# Patient Record
Sex: Male | Born: 1950 | Race: White | Hispanic: No | Marital: Married | Smoking: Never smoker
Health system: Southern US, Community
[De-identification: ages and names within clinical notes are randomized; demographics above are authoritative.]

## PROBLEM LIST (undated history)

## (undated) DIAGNOSIS — G4730 Sleep apnea, unspecified: Secondary | ICD-10-CM

## (undated) DIAGNOSIS — E119 Type 2 diabetes mellitus without complications: Secondary | ICD-10-CM

## (undated) DIAGNOSIS — I4891 Unspecified atrial fibrillation: Secondary | ICD-10-CM

## (undated) HISTORY — DX: Unspecified atrial fibrillation: I48.91

## (undated) HISTORY — PX: OTHER SURGICAL HISTORY: SHX169

## (undated) HISTORY — DX: Sleep apnea, unspecified: G47.30

---

## 2004-03-25 ENCOUNTER — Ambulatory Visit (HOSPITAL_COMMUNITY): Admission: RE | Admit: 2004-03-25 | Discharge: 2004-03-25 | Payer: Self-pay | Admitting: *Deleted

## 2009-03-12 ENCOUNTER — Encounter: Admission: RE | Admit: 2009-03-12 | Discharge: 2009-03-12 | Payer: Self-pay | Admitting: Family Medicine

## 2009-03-24 ENCOUNTER — Encounter: Admission: RE | Admit: 2009-03-24 | Discharge: 2009-03-24 | Payer: Self-pay | Admitting: Family Medicine

## 2011-02-03 NOTE — Op Note (Signed)
NAME:  Gabriel Hayes, Gabriel Hayes                          ACCOUNT NO.:  [id number]   MEDICAL RECORD NO.:  [id number]                   PATIENT TYPE:  AMB   LOCATION:  ENDO                                 FACILITY:  [facility]   PHYSICIAN:  Georgiana Spinner, M.D.                 DATE OF BIRTH:  1951/05/19   DATE OF PROCEDURE:  DATE OF DISCHARGE:                                 OPERATIVE REPORT   PROCEDURE:  Colonoscopy.   INDICATIONS:  Colon polyp.  Colon cancer screening.   ANESTHESIA:  Demerol 100 mg, Versed 10 mg.   DESCRIPTION OF PROCEDURE:  With the patient mildly sedated and in the left  lateral decubitus position, the Olympus videoscopic colonoscope was inserted  in the rectum and passed under direct vision to the cecum.  This was done  with pressure applied and a change of scope from the PCF-160 to CF-160, both  adjustable, with pressure applied to the abdomen and the patient rolled into  various positions, subsequently rolled to his right side.  We were able to  reach the cecum identified by ileocecal valve and base of the cecum, both of  which were photographed.  From this point, the colonoscope was slowly  withdrawn, taking circumferential views of the colonic mucosa, stopping in  the rectum which appeared normal on direct and showed hemorrhoids on  retroflexed view.  The endoscope was straightened and withdrawn.  The  patient's vital signs and pulse oximetry remained stable.  The patient  tolerated the procedure well without apparent complications.   FINDINGS:  Very difficult, tortuous colon.  Also somewhat difficult due to  the patient's body habitus but unremarkable examination other and internal  hemorrhoids.   PLAN:  Consider repeat examination in 5-10 years.                                               Georgiana Spinner, M.D.    GMO/MEDQ  D:  03/25/2004  T:  03/25/2004  Job:  [id number]   cc:   Meredith Staggers, M.D.  [address]  [place]  [place] [address]  Fax:  [phone]

## 2011-07-18 ENCOUNTER — Other Ambulatory Visit (HOSPITAL_COMMUNITY): Payer: Self-pay | Admitting: Family Medicine

## 2011-07-18 ENCOUNTER — Ambulatory Visit (HOSPITAL_COMMUNITY)
Admission: RE | Admit: 2011-07-18 | Discharge: 2011-07-18 | Disposition: A | Discharge status: Home / Self Care | Payer: BC Managed Care – PPO | Source: Ambulatory Visit | Attending: Family Medicine | Admitting: Family Medicine

## 2011-07-18 DIAGNOSIS — N509 Disorder of male genital organs, unspecified: Secondary | ICD-10-CM | POA: Insufficient documentation

## 2011-07-18 DIAGNOSIS — R52 Pain, unspecified: Secondary | ICD-10-CM

## 2011-07-18 DIAGNOSIS — N453 Epididymo-orchitis: Secondary | ICD-10-CM | POA: Insufficient documentation

## 2014-10-01 DIAGNOSIS — G4731 Primary central sleep apnea: Secondary | ICD-10-CM | POA: Diagnosis not present

## 2014-10-01 DIAGNOSIS — G4733 Obstructive sleep apnea (adult) (pediatric): Secondary | ICD-10-CM | POA: Diagnosis not present

## 2015-02-12 DIAGNOSIS — G4733 Obstructive sleep apnea (adult) (pediatric): Secondary | ICD-10-CM | POA: Diagnosis not present

## 2015-03-26 DIAGNOSIS — Z9989 Dependence on other enabling machines and devices: Secondary | ICD-10-CM | POA: Diagnosis not present

## 2015-03-26 DIAGNOSIS — G4733 Obstructive sleep apnea (adult) (pediatric): Secondary | ICD-10-CM | POA: Diagnosis not present

## 2015-05-06 DIAGNOSIS — Z0000 Encounter for general adult medical examination without abnormal findings: Secondary | ICD-10-CM | POA: Diagnosis not present

## 2015-05-06 DIAGNOSIS — R7309 Other abnormal glucose: Secondary | ICD-10-CM | POA: Diagnosis not present

## 2015-05-06 DIAGNOSIS — G4730 Sleep apnea, unspecified: Secondary | ICD-10-CM | POA: Diagnosis not present

## 2015-05-06 DIAGNOSIS — M47812 Spondylosis without myelopathy or radiculopathy, cervical region: Secondary | ICD-10-CM | POA: Diagnosis not present

## 2015-05-06 DIAGNOSIS — D229 Melanocytic nevi, unspecified: Secondary | ICD-10-CM | POA: Diagnosis not present

## 2015-05-06 DIAGNOSIS — E785 Hyperlipidemia, unspecified: Secondary | ICD-10-CM | POA: Diagnosis not present

## 2015-05-06 DIAGNOSIS — R0982 Postnasal drip: Secondary | ICD-10-CM | POA: Diagnosis not present

## 2015-05-06 DIAGNOSIS — Z1211 Encounter for screening for malignant neoplasm of colon: Secondary | ICD-10-CM | POA: Diagnosis not present

## 2015-06-15 DIAGNOSIS — G4733 Obstructive sleep apnea (adult) (pediatric): Secondary | ICD-10-CM | POA: Diagnosis not present

## 2015-08-09 DIAGNOSIS — J069 Acute upper respiratory infection, unspecified: Secondary | ICD-10-CM | POA: Diagnosis not present

## 2015-08-09 DIAGNOSIS — R05 Cough: Secondary | ICD-10-CM | POA: Diagnosis not present

## 2015-08-19 DIAGNOSIS — J0100 Acute maxillary sinusitis, unspecified: Secondary | ICD-10-CM | POA: Diagnosis not present

## 2015-08-19 DIAGNOSIS — J209 Acute bronchitis, unspecified: Secondary | ICD-10-CM | POA: Diagnosis not present

## 2015-08-31 DIAGNOSIS — R040 Epistaxis: Secondary | ICD-10-CM | POA: Diagnosis not present

## 2015-09-01 DIAGNOSIS — H2513 Age-related nuclear cataract, bilateral: Secondary | ICD-10-CM | POA: Diagnosis not present

## 2015-09-01 DIAGNOSIS — H4911 Fourth [trochlear] nerve palsy, right eye: Secondary | ICD-10-CM | POA: Diagnosis not present

## 2015-09-01 DIAGNOSIS — H40023 Open angle with borderline findings, high risk, bilateral: Secondary | ICD-10-CM | POA: Diagnosis not present

## 2015-09-07 DIAGNOSIS — J069 Acute upper respiratory infection, unspecified: Secondary | ICD-10-CM | POA: Diagnosis not present

## 2015-09-07 DIAGNOSIS — J0100 Acute maxillary sinusitis, unspecified: Secondary | ICD-10-CM | POA: Diagnosis not present

## 2015-10-20 DIAGNOSIS — F5104 Psychophysiologic insomnia: Secondary | ICD-10-CM | POA: Diagnosis not present

## 2015-10-20 DIAGNOSIS — G4733 Obstructive sleep apnea (adult) (pediatric): Secondary | ICD-10-CM | POA: Diagnosis not present

## 2015-10-20 DIAGNOSIS — Z9989 Dependence on other enabling machines and devices: Secondary | ICD-10-CM | POA: Diagnosis not present

## 2015-11-11 DIAGNOSIS — J0100 Acute maxillary sinusitis, unspecified: Secondary | ICD-10-CM | POA: Diagnosis not present

## 2015-12-28 DIAGNOSIS — G4733 Obstructive sleep apnea (adult) (pediatric): Secondary | ICD-10-CM | POA: Diagnosis not present

## 2016-03-02 DIAGNOSIS — H5210 Myopia, unspecified eye: Secondary | ICD-10-CM | POA: Diagnosis not present

## 2016-03-02 DIAGNOSIS — H52203 Unspecified astigmatism, bilateral: Secondary | ICD-10-CM | POA: Diagnosis not present

## 2016-03-02 DIAGNOSIS — Z0100 Encounter for examination of eyes and vision without abnormal findings: Secondary | ICD-10-CM | POA: Diagnosis not present

## 2016-05-08 DIAGNOSIS — E785 Hyperlipidemia, unspecified: Secondary | ICD-10-CM | POA: Diagnosis not present

## 2016-05-08 DIAGNOSIS — R040 Epistaxis: Secondary | ICD-10-CM | POA: Diagnosis not present

## 2016-05-08 DIAGNOSIS — Z0000 Encounter for general adult medical examination without abnormal findings: Secondary | ICD-10-CM | POA: Diagnosis not present

## 2016-05-08 DIAGNOSIS — R7301 Impaired fasting glucose: Secondary | ICD-10-CM | POA: Diagnosis not present

## 2016-05-08 DIAGNOSIS — Z125 Encounter for screening for malignant neoplasm of prostate: Secondary | ICD-10-CM | POA: Diagnosis not present

## 2016-05-08 DIAGNOSIS — J0191 Acute recurrent sinusitis, unspecified: Secondary | ICD-10-CM | POA: Diagnosis not present

## 2016-05-08 DIAGNOSIS — G4730 Sleep apnea, unspecified: Secondary | ICD-10-CM | POA: Diagnosis not present

## 2016-05-08 DIAGNOSIS — Z1211 Encounter for screening for malignant neoplasm of colon: Secondary | ICD-10-CM | POA: Diagnosis not present

## 2016-05-08 DIAGNOSIS — M47812 Spondylosis without myelopathy or radiculopathy, cervical region: Secondary | ICD-10-CM | POA: Diagnosis not present

## 2016-05-08 DIAGNOSIS — D229 Melanocytic nevi, unspecified: Secondary | ICD-10-CM | POA: Diagnosis not present

## 2016-07-31 DIAGNOSIS — G4733 Obstructive sleep apnea (adult) (pediatric): Secondary | ICD-10-CM | POA: Diagnosis not present

## 2016-08-30 DIAGNOSIS — Z23 Encounter for immunization: Secondary | ICD-10-CM | POA: Diagnosis not present

## 2016-09-29 DIAGNOSIS — Z8679 Personal history of other diseases of the circulatory system: Secondary | ICD-10-CM | POA: Diagnosis not present

## 2016-09-29 DIAGNOSIS — G4730 Sleep apnea, unspecified: Secondary | ICD-10-CM | POA: Diagnosis not present

## 2016-10-06 DIAGNOSIS — F5104 Psychophysiologic insomnia: Secondary | ICD-10-CM | POA: Diagnosis not present

## 2016-10-06 DIAGNOSIS — Z9989 Dependence on other enabling machines and devices: Secondary | ICD-10-CM | POA: Diagnosis not present

## 2016-10-06 DIAGNOSIS — G4733 Obstructive sleep apnea (adult) (pediatric): Secondary | ICD-10-CM | POA: Diagnosis not present

## 2016-11-06 DIAGNOSIS — G4733 Obstructive sleep apnea (adult) (pediatric): Secondary | ICD-10-CM | POA: Diagnosis not present

## 2017-01-11 ENCOUNTER — Other Ambulatory Visit: Payer: Self-pay | Admitting: Family Medicine

## 2017-01-11 ENCOUNTER — Ambulatory Visit
Admission: RE | Admit: 2017-01-11 | Discharge: 2017-01-11 | Disposition: A | Discharge status: Home / Self Care | Payer: Medicare HMO | Source: Ambulatory Visit | Attending: Family Medicine | Admitting: Family Medicine

## 2017-01-11 DIAGNOSIS — M545 Low back pain, unspecified: Secondary | ICD-10-CM

## 2017-01-11 DIAGNOSIS — M5136 Other intervertebral disc degeneration, lumbar region: Secondary | ICD-10-CM | POA: Diagnosis not present

## 2017-02-06 DIAGNOSIS — G4733 Obstructive sleep apnea (adult) (pediatric): Secondary | ICD-10-CM | POA: Diagnosis not present

## 2017-02-26 DIAGNOSIS — M779 Enthesopathy, unspecified: Secondary | ICD-10-CM | POA: Diagnosis not present

## 2017-02-26 DIAGNOSIS — M25561 Pain in right knee: Secondary | ICD-10-CM | POA: Diagnosis not present

## 2017-02-26 DIAGNOSIS — M7651 Patellar tendinitis, right knee: Secondary | ICD-10-CM | POA: Diagnosis not present

## 2017-02-26 DIAGNOSIS — I4891 Unspecified atrial fibrillation: Secondary | ICD-10-CM | POA: Diagnosis not present

## 2017-02-26 DIAGNOSIS — Z8249 Family history of ischemic heart disease and other diseases of the circulatory system: Secondary | ICD-10-CM | POA: Diagnosis not present

## 2017-02-26 DIAGNOSIS — G4730 Sleep apnea, unspecified: Secondary | ICD-10-CM | POA: Diagnosis not present

## 2017-03-05 DIAGNOSIS — M7651 Patellar tendinitis, right knee: Secondary | ICD-10-CM | POA: Diagnosis not present

## 2017-03-05 DIAGNOSIS — M659 Synovitis and tenosynovitis, unspecified: Secondary | ICD-10-CM | POA: Diagnosis not present

## 2017-03-13 DIAGNOSIS — H02834 Dermatochalasis of left upper eyelid: Secondary | ICD-10-CM | POA: Diagnosis not present

## 2017-03-13 DIAGNOSIS — H524 Presbyopia: Secondary | ICD-10-CM | POA: Diagnosis not present

## 2017-03-13 DIAGNOSIS — H43393 Other vitreous opacities, bilateral: Secondary | ICD-10-CM | POA: Diagnosis not present

## 2017-03-13 DIAGNOSIS — H04123 Dry eye syndrome of bilateral lacrimal glands: Secondary | ICD-10-CM | POA: Diagnosis not present

## 2017-03-13 DIAGNOSIS — H52203 Unspecified astigmatism, bilateral: Secondary | ICD-10-CM | POA: Diagnosis not present

## 2017-03-13 DIAGNOSIS — H02831 Dermatochalasis of right upper eyelid: Secondary | ICD-10-CM | POA: Diagnosis not present

## 2017-03-13 DIAGNOSIS — H2513 Age-related nuclear cataract, bilateral: Secondary | ICD-10-CM | POA: Diagnosis not present

## 2017-03-26 DIAGNOSIS — M7651 Patellar tendinitis, right knee: Secondary | ICD-10-CM | POA: Diagnosis not present

## 2017-03-26 DIAGNOSIS — M659 Synovitis and tenosynovitis, unspecified: Secondary | ICD-10-CM | POA: Diagnosis not present

## 2017-04-19 DIAGNOSIS — G4733 Obstructive sleep apnea (adult) (pediatric): Secondary | ICD-10-CM | POA: Diagnosis not present

## 2017-04-23 DIAGNOSIS — R197 Diarrhea, unspecified: Secondary | ICD-10-CM | POA: Diagnosis not present

## 2017-04-23 DIAGNOSIS — R1084 Generalized abdominal pain: Secondary | ICD-10-CM | POA: Diagnosis not present

## 2017-04-23 DIAGNOSIS — I700 Atherosclerosis of aorta: Secondary | ICD-10-CM | POA: Diagnosis not present

## 2017-04-23 DIAGNOSIS — I2510 Atherosclerotic heart disease of native coronary artery without angina pectoris: Secondary | ICD-10-CM | POA: Diagnosis not present

## 2017-04-23 DIAGNOSIS — R109 Unspecified abdominal pain: Secondary | ICD-10-CM | POA: Diagnosis not present

## 2017-04-23 DIAGNOSIS — N281 Cyst of kidney, acquired: Secondary | ICD-10-CM | POA: Diagnosis not present

## 2017-04-23 DIAGNOSIS — G4730 Sleep apnea, unspecified: Secondary | ICD-10-CM | POA: Diagnosis not present

## 2017-04-23 DIAGNOSIS — I4891 Unspecified atrial fibrillation: Secondary | ICD-10-CM | POA: Diagnosis not present

## 2017-04-23 DIAGNOSIS — R110 Nausea: Secondary | ICD-10-CM | POA: Diagnosis not present

## 2017-04-23 DIAGNOSIS — R140 Abdominal distension (gaseous): Secondary | ICD-10-CM | POA: Diagnosis not present

## 2017-04-23 DIAGNOSIS — K529 Noninfective gastroenteritis and colitis, unspecified: Secondary | ICD-10-CM | POA: Diagnosis not present

## 2017-04-24 DIAGNOSIS — R197 Diarrhea, unspecified: Secondary | ICD-10-CM | POA: Diagnosis not present

## 2017-04-24 DIAGNOSIS — R109 Unspecified abdominal pain: Secondary | ICD-10-CM | POA: Diagnosis not present

## 2017-04-26 DIAGNOSIS — G4733 Obstructive sleep apnea (adult) (pediatric): Secondary | ICD-10-CM | POA: Diagnosis not present

## 2017-04-27 DIAGNOSIS — Z1389 Encounter for screening for other disorder: Secondary | ICD-10-CM | POA: Diagnosis not present

## 2017-04-27 DIAGNOSIS — Z8719 Personal history of other diseases of the digestive system: Secondary | ICD-10-CM | POA: Diagnosis not present

## 2017-04-27 DIAGNOSIS — M25561 Pain in right knee: Secondary | ICD-10-CM | POA: Diagnosis not present

## 2017-04-27 DIAGNOSIS — K529 Noninfective gastroenteritis and colitis, unspecified: Secondary | ICD-10-CM | POA: Diagnosis not present

## 2017-05-01 DIAGNOSIS — R1084 Generalized abdominal pain: Secondary | ICD-10-CM | POA: Diagnosis not present

## 2017-05-10 DIAGNOSIS — Z125 Encounter for screening for malignant neoplasm of prostate: Secondary | ICD-10-CM | POA: Diagnosis not present

## 2017-05-10 DIAGNOSIS — I700 Atherosclerosis of aorta: Secondary | ICD-10-CM | POA: Diagnosis not present

## 2017-05-10 DIAGNOSIS — M6208 Separation of muscle (nontraumatic), other site: Secondary | ICD-10-CM | POA: Diagnosis not present

## 2017-05-10 DIAGNOSIS — Z1211 Encounter for screening for malignant neoplasm of colon: Secondary | ICD-10-CM | POA: Diagnosis not present

## 2017-05-10 DIAGNOSIS — R7303 Prediabetes: Secondary | ICD-10-CM | POA: Diagnosis not present

## 2017-05-10 DIAGNOSIS — Z0000 Encounter for general adult medical examination without abnormal findings: Secondary | ICD-10-CM | POA: Diagnosis not present

## 2017-05-10 DIAGNOSIS — K529 Noninfective gastroenteritis and colitis, unspecified: Secondary | ICD-10-CM | POA: Diagnosis not present

## 2017-05-10 DIAGNOSIS — G4730 Sleep apnea, unspecified: Secondary | ICD-10-CM | POA: Diagnosis not present

## 2017-05-10 DIAGNOSIS — E7800 Pure hypercholesterolemia, unspecified: Secondary | ICD-10-CM | POA: Diagnosis not present

## 2017-05-10 DIAGNOSIS — M47812 Spondylosis without myelopathy or radiculopathy, cervical region: Secondary | ICD-10-CM | POA: Diagnosis not present

## 2017-05-10 DIAGNOSIS — Z23 Encounter for immunization: Secondary | ICD-10-CM | POA: Diagnosis not present

## 2017-05-23 DIAGNOSIS — E7800 Pure hypercholesterolemia, unspecified: Secondary | ICD-10-CM | POA: Diagnosis not present

## 2017-06-22 DIAGNOSIS — G4733 Obstructive sleep apnea (adult) (pediatric): Secondary | ICD-10-CM | POA: Diagnosis not present

## 2017-10-16 DIAGNOSIS — G4733 Obstructive sleep apnea (adult) (pediatric): Secondary | ICD-10-CM | POA: Diagnosis not present

## 2017-12-07 DIAGNOSIS — G4733 Obstructive sleep apnea (adult) (pediatric): Secondary | ICD-10-CM | POA: Diagnosis not present

## 2017-12-07 DIAGNOSIS — F5104 Psychophysiologic insomnia: Secondary | ICD-10-CM | POA: Diagnosis not present

## 2018-01-20 DIAGNOSIS — G4733 Obstructive sleep apnea (adult) (pediatric): Secondary | ICD-10-CM | POA: Diagnosis not present

## 2018-01-22 IMAGING — DX DG LUMBAR SPINE 2-3V
3 series · 3 of 3 positions shown · non-contrast
Comparison: None

CLINICAL DATA: Right-sided low back pain for 1 month

EXAM:
LUMBAR SPINE - 2-3 VIEW

[dg lumbar spine 2-3 views (1 of 3)]
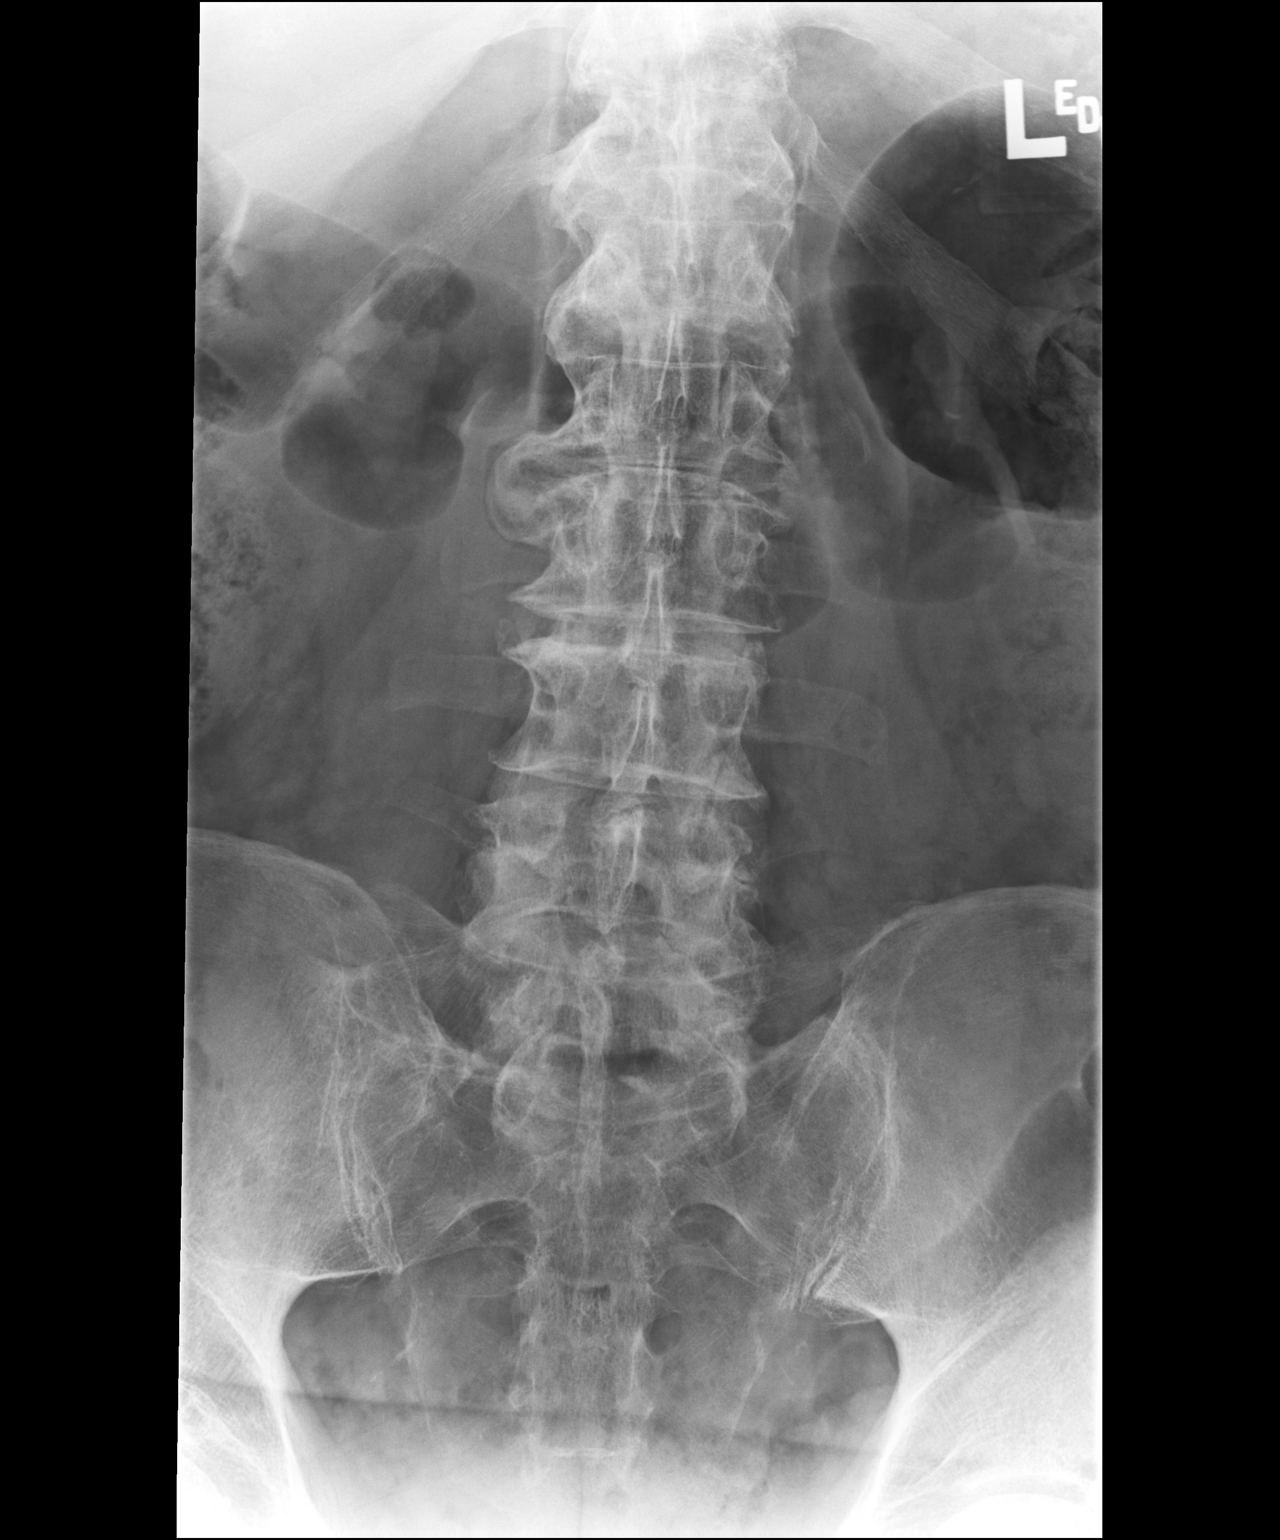

[dg lumbar spine 2-3 views (2 of 3)]
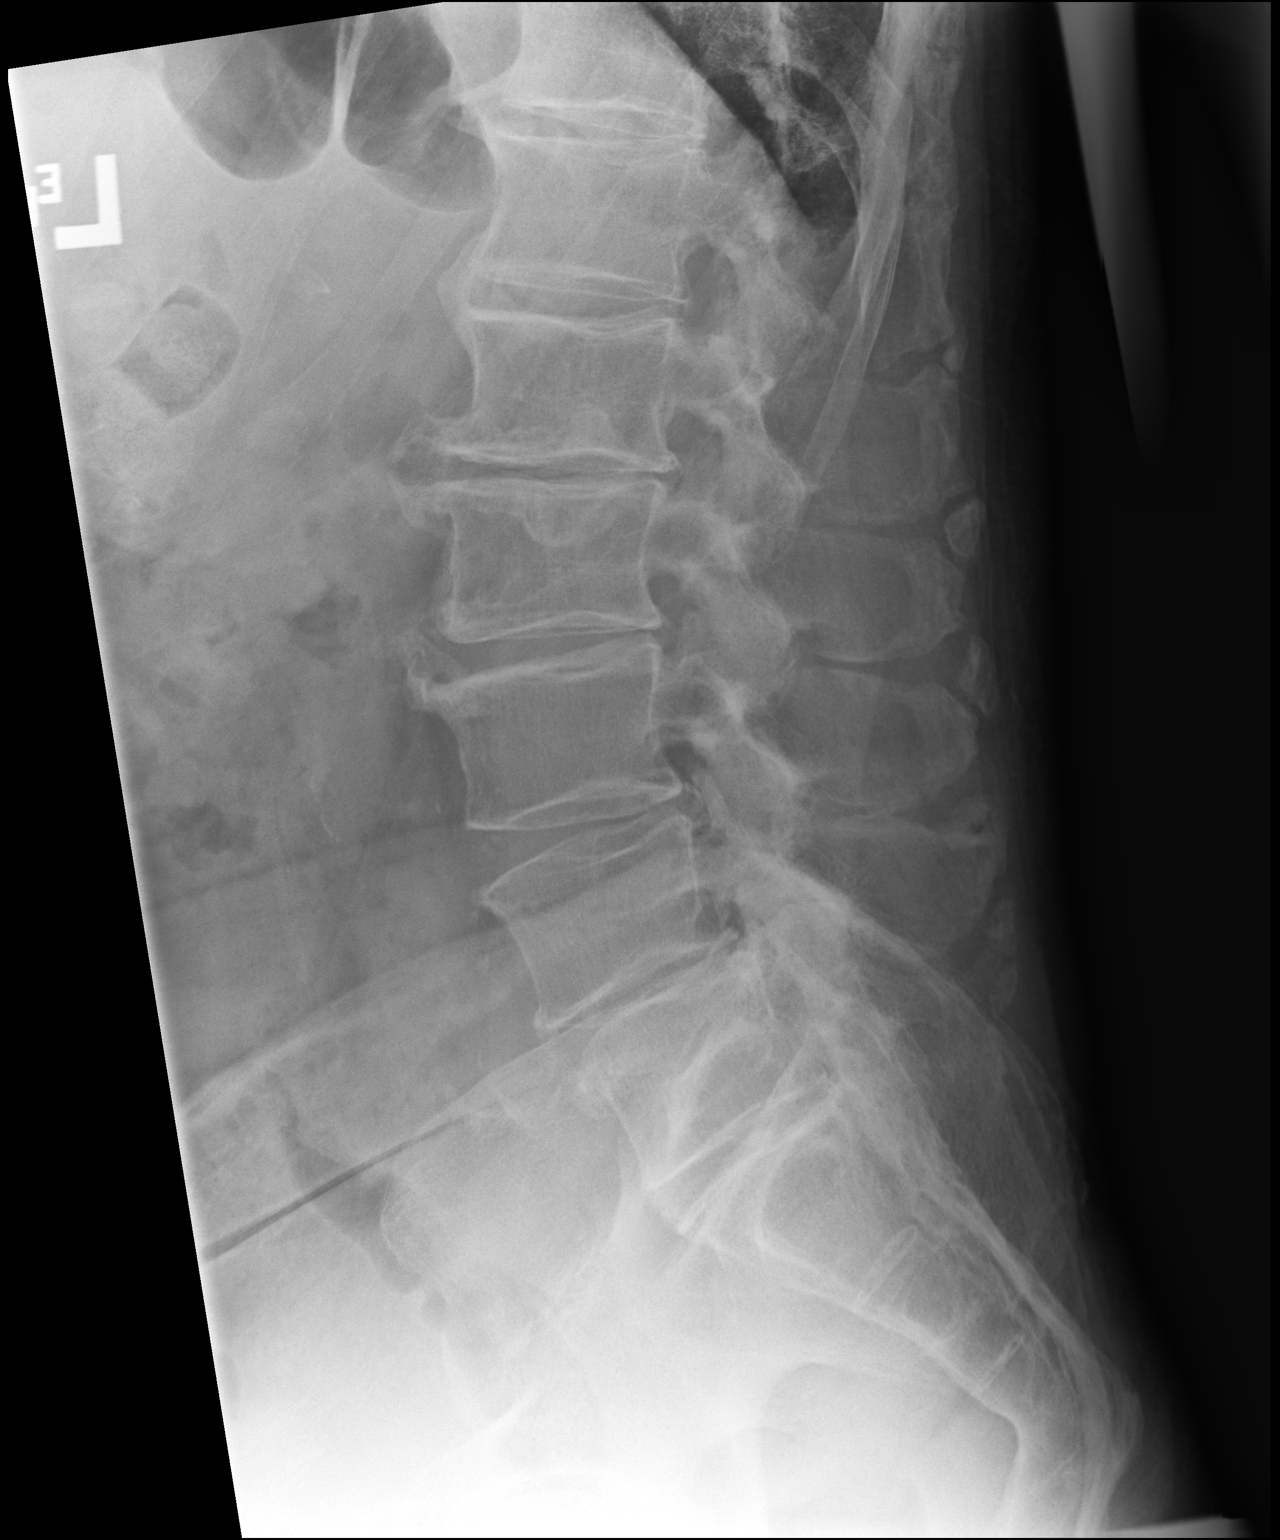

[dg lumbar spine 2-3 views (3 of 3)]
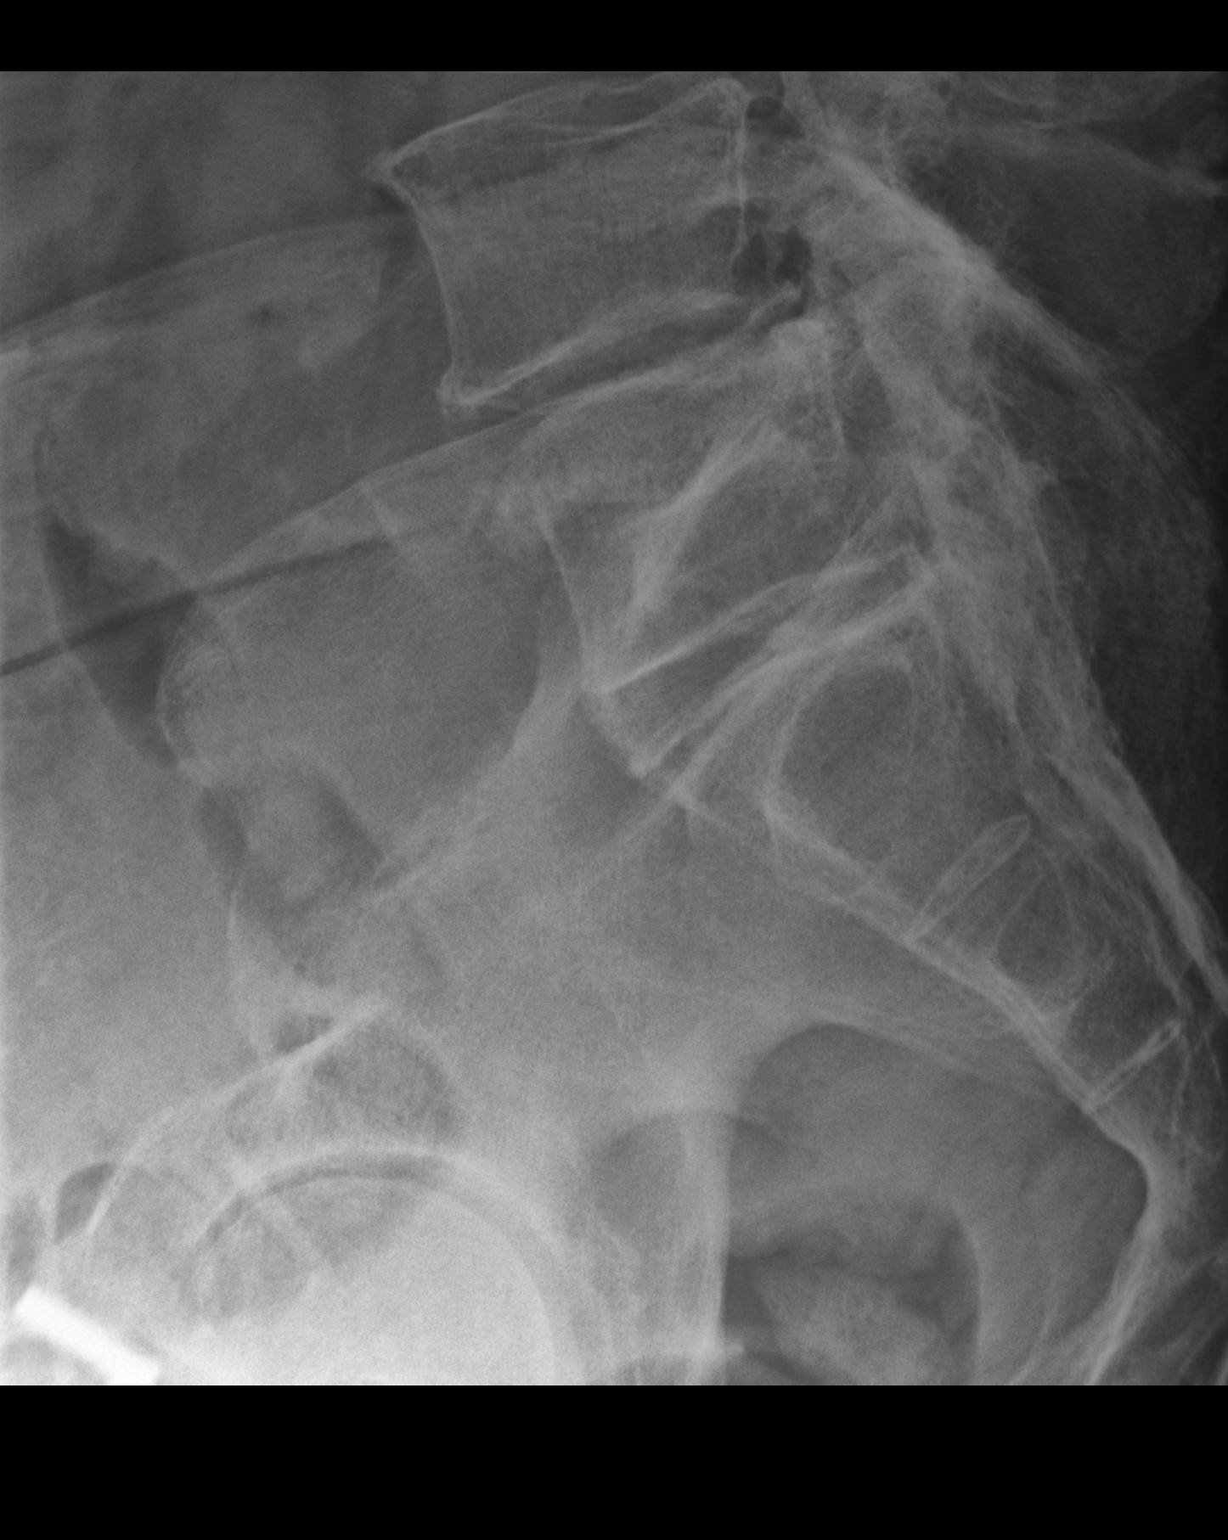

[3 of 3 positions shown; findings below may reference images not displayed]

FINDINGS: There is slight anterolisthesis of L4 on L5 by approximately 7 mm
which appears to be due to degenerative change of the facet joints.
There is degenerative disc disease at L1-2, L2-3, and L4-5 levels
with loss of disc space and spurring. No compression deformity is
seen. The SI joints appear corticated.
IMPRESSION: 1. 7 mm anterolisthesis of L4 on L5 which appears degenerative in
origin.
2. Degenerative disc disease as noted above. No acute compression
deformity.

## 2018-01-25 DIAGNOSIS — G4733 Obstructive sleep apnea (adult) (pediatric): Secondary | ICD-10-CM | POA: Diagnosis not present

## 2018-03-04 DIAGNOSIS — G4733 Obstructive sleep apnea (adult) (pediatric): Secondary | ICD-10-CM | POA: Diagnosis not present

## 2018-03-14 DIAGNOSIS — H2513 Age-related nuclear cataract, bilateral: Secondary | ICD-10-CM | POA: Diagnosis not present

## 2018-03-14 DIAGNOSIS — H43393 Other vitreous opacities, bilateral: Secondary | ICD-10-CM | POA: Diagnosis not present

## 2018-03-14 DIAGNOSIS — H02831 Dermatochalasis of right upper eyelid: Secondary | ICD-10-CM | POA: Diagnosis not present

## 2018-03-14 DIAGNOSIS — H16223 Keratoconjunctivitis sicca, not specified as Sjogren's, bilateral: Secondary | ICD-10-CM | POA: Diagnosis not present

## 2018-03-14 DIAGNOSIS — H02834 Dermatochalasis of left upper eyelid: Secondary | ICD-10-CM | POA: Diagnosis not present

## 2018-03-14 DIAGNOSIS — H524 Presbyopia: Secondary | ICD-10-CM | POA: Diagnosis not present

## 2018-03-14 DIAGNOSIS — H52203 Unspecified astigmatism, bilateral: Secondary | ICD-10-CM | POA: Diagnosis not present

## 2018-04-29 DIAGNOSIS — R9431 Abnormal electrocardiogram [ECG] [EKG]: Secondary | ICD-10-CM | POA: Diagnosis not present

## 2018-04-29 DIAGNOSIS — I4891 Unspecified atrial fibrillation: Secondary | ICD-10-CM | POA: Diagnosis not present

## 2018-05-08 DIAGNOSIS — G4737 Central sleep apnea in conditions classified elsewhere: Secondary | ICD-10-CM | POA: Diagnosis not present

## 2018-05-08 DIAGNOSIS — G4733 Obstructive sleep apnea (adult) (pediatric): Secondary | ICD-10-CM | POA: Diagnosis not present

## 2018-05-14 DIAGNOSIS — Z1211 Encounter for screening for malignant neoplasm of colon: Secondary | ICD-10-CM | POA: Diagnosis not present

## 2018-05-14 DIAGNOSIS — G4730 Sleep apnea, unspecified: Secondary | ICD-10-CM | POA: Diagnosis not present

## 2018-05-14 DIAGNOSIS — Z1389 Encounter for screening for other disorder: Secondary | ICD-10-CM | POA: Diagnosis not present

## 2018-05-14 DIAGNOSIS — Z23 Encounter for immunization: Secondary | ICD-10-CM | POA: Diagnosis not present

## 2018-05-14 DIAGNOSIS — E7800 Pure hypercholesterolemia, unspecified: Secondary | ICD-10-CM | POA: Diagnosis not present

## 2018-05-14 DIAGNOSIS — I700 Atherosclerosis of aorta: Secondary | ICD-10-CM | POA: Diagnosis not present

## 2018-05-14 DIAGNOSIS — Z0000 Encounter for general adult medical examination without abnormal findings: Secondary | ICD-10-CM | POA: Diagnosis not present

## 2018-05-14 DIAGNOSIS — R7303 Prediabetes: Secondary | ICD-10-CM | POA: Diagnosis not present

## 2018-05-14 DIAGNOSIS — M47812 Spondylosis without myelopathy or radiculopathy, cervical region: Secondary | ICD-10-CM | POA: Diagnosis not present

## 2018-05-14 DIAGNOSIS — Z125 Encounter for screening for malignant neoplasm of prostate: Secondary | ICD-10-CM | POA: Diagnosis not present

## 2018-06-08 DIAGNOSIS — G4733 Obstructive sleep apnea (adult) (pediatric): Secondary | ICD-10-CM | POA: Diagnosis not present

## 2018-06-08 DIAGNOSIS — G4731 Primary central sleep apnea: Secondary | ICD-10-CM | POA: Diagnosis not present

## 2018-06-12 DIAGNOSIS — G4733 Obstructive sleep apnea (adult) (pediatric): Secondary | ICD-10-CM | POA: Diagnosis not present

## 2018-06-21 DIAGNOSIS — G4733 Obstructive sleep apnea (adult) (pediatric): Secondary | ICD-10-CM | POA: Diagnosis not present

## 2018-06-21 DIAGNOSIS — Z9989 Dependence on other enabling machines and devices: Secondary | ICD-10-CM | POA: Diagnosis not present

## 2018-06-21 DIAGNOSIS — F5104 Psychophysiologic insomnia: Secondary | ICD-10-CM | POA: Diagnosis not present

## 2018-07-08 DIAGNOSIS — G4733 Obstructive sleep apnea (adult) (pediatric): Secondary | ICD-10-CM | POA: Diagnosis not present

## 2018-07-08 DIAGNOSIS — G4731 Primary central sleep apnea: Secondary | ICD-10-CM | POA: Diagnosis not present

## 2018-08-08 DIAGNOSIS — G4731 Primary central sleep apnea: Secondary | ICD-10-CM | POA: Diagnosis not present

## 2018-09-07 DIAGNOSIS — G4731 Primary central sleep apnea: Secondary | ICD-10-CM | POA: Diagnosis not present

## 2018-09-10 DIAGNOSIS — G4733 Obstructive sleep apnea (adult) (pediatric): Secondary | ICD-10-CM | POA: Diagnosis not present

## 2018-10-08 DIAGNOSIS — G4731 Primary central sleep apnea: Secondary | ICD-10-CM | POA: Diagnosis not present

## 2018-11-08 DIAGNOSIS — G4731 Primary central sleep apnea: Secondary | ICD-10-CM | POA: Diagnosis not present

## 2018-11-12 DIAGNOSIS — E1169 Type 2 diabetes mellitus with other specified complication: Secondary | ICD-10-CM | POA: Diagnosis not present

## 2018-11-12 DIAGNOSIS — I700 Atherosclerosis of aorta: Secondary | ICD-10-CM | POA: Diagnosis not present

## 2018-11-12 DIAGNOSIS — G4730 Sleep apnea, unspecified: Secondary | ICD-10-CM | POA: Diagnosis not present

## 2018-11-12 DIAGNOSIS — E7800 Pure hypercholesterolemia, unspecified: Secondary | ICD-10-CM | POA: Diagnosis not present

## 2018-11-19 DIAGNOSIS — J0100 Acute maxillary sinusitis, unspecified: Secondary | ICD-10-CM | POA: Diagnosis not present

## 2018-11-19 DIAGNOSIS — Z6841 Body Mass Index (BMI) 40.0 and over, adult: Secondary | ICD-10-CM | POA: Diagnosis not present

## 2018-11-19 DIAGNOSIS — R05 Cough: Secondary | ICD-10-CM | POA: Diagnosis not present

## 2018-11-26 DIAGNOSIS — G4733 Obstructive sleep apnea (adult) (pediatric): Secondary | ICD-10-CM | POA: Diagnosis not present

## 2018-11-28 DIAGNOSIS — R040 Epistaxis: Secondary | ICD-10-CM | POA: Diagnosis not present

## 2018-12-07 DIAGNOSIS — G4731 Primary central sleep apnea: Secondary | ICD-10-CM | POA: Diagnosis not present

## 2018-12-12 DIAGNOSIS — R040 Epistaxis: Secondary | ICD-10-CM | POA: Diagnosis not present

## 2018-12-31 DIAGNOSIS — G4733 Obstructive sleep apnea (adult) (pediatric): Secondary | ICD-10-CM | POA: Diagnosis not present

## 2019-01-07 DIAGNOSIS — G4731 Primary central sleep apnea: Secondary | ICD-10-CM | POA: Diagnosis not present

## 2019-01-07 DIAGNOSIS — Z87898 Personal history of other specified conditions: Secondary | ICD-10-CM | POA: Diagnosis not present

## 2019-01-07 DIAGNOSIS — S30863A Insect bite (nonvenomous) of scrotum and testes, initial encounter: Secondary | ICD-10-CM | POA: Diagnosis not present

## 2019-01-07 DIAGNOSIS — R05 Cough: Secondary | ICD-10-CM | POA: Diagnosis not present

## 2019-01-07 DIAGNOSIS — W57XXXA Bitten or stung by nonvenomous insect and other nonvenomous arthropods, initial encounter: Secondary | ICD-10-CM | POA: Diagnosis not present

## 2019-02-06 DIAGNOSIS — G4731 Primary central sleep apnea: Secondary | ICD-10-CM | POA: Diagnosis not present

## 2019-03-09 DIAGNOSIS — G4731 Primary central sleep apnea: Secondary | ICD-10-CM | POA: Diagnosis not present

## 2019-03-17 DIAGNOSIS — H02834 Dermatochalasis of left upper eyelid: Secondary | ICD-10-CM | POA: Diagnosis not present

## 2019-03-17 DIAGNOSIS — H43393 Other vitreous opacities, bilateral: Secondary | ICD-10-CM | POA: Diagnosis not present

## 2019-03-17 DIAGNOSIS — H5211 Myopia, right eye: Secondary | ICD-10-CM | POA: Diagnosis not present

## 2019-03-17 DIAGNOSIS — H02831 Dermatochalasis of right upper eyelid: Secondary | ICD-10-CM | POA: Diagnosis not present

## 2019-03-17 DIAGNOSIS — H524 Presbyopia: Secondary | ICD-10-CM | POA: Diagnosis not present

## 2019-03-17 DIAGNOSIS — H16223 Keratoconjunctivitis sicca, not specified as Sjogren's, bilateral: Secondary | ICD-10-CM | POA: Diagnosis not present

## 2019-03-17 DIAGNOSIS — H5202 Hypermetropia, left eye: Secondary | ICD-10-CM | POA: Diagnosis not present

## 2019-03-17 DIAGNOSIS — H2513 Age-related nuclear cataract, bilateral: Secondary | ICD-10-CM | POA: Diagnosis not present

## 2019-03-17 DIAGNOSIS — H52203 Unspecified astigmatism, bilateral: Secondary | ICD-10-CM | POA: Diagnosis not present

## 2019-04-07 DIAGNOSIS — G4731 Primary central sleep apnea: Secondary | ICD-10-CM | POA: Diagnosis not present

## 2019-04-07 DIAGNOSIS — G4733 Obstructive sleep apnea (adult) (pediatric): Secondary | ICD-10-CM | POA: Diagnosis not present

## 2019-04-08 DIAGNOSIS — G4733 Obstructive sleep apnea (adult) (pediatric): Secondary | ICD-10-CM | POA: Diagnosis not present

## 2019-04-08 DIAGNOSIS — G4731 Primary central sleep apnea: Secondary | ICD-10-CM | POA: Diagnosis not present

## 2019-05-09 DIAGNOSIS — G4731 Primary central sleep apnea: Secondary | ICD-10-CM | POA: Diagnosis not present

## 2019-05-16 DIAGNOSIS — Z1389 Encounter for screening for other disorder: Secondary | ICD-10-CM | POA: Diagnosis not present

## 2019-05-16 DIAGNOSIS — Z1211 Encounter for screening for malignant neoplasm of colon: Secondary | ICD-10-CM | POA: Diagnosis not present

## 2019-05-16 DIAGNOSIS — E1169 Type 2 diabetes mellitus with other specified complication: Secondary | ICD-10-CM | POA: Diagnosis not present

## 2019-05-16 DIAGNOSIS — Z125 Encounter for screening for malignant neoplasm of prostate: Secondary | ICD-10-CM | POA: Diagnosis not present

## 2019-05-16 DIAGNOSIS — M47812 Spondylosis without myelopathy or radiculopathy, cervical region: Secondary | ICD-10-CM | POA: Diagnosis not present

## 2019-05-16 DIAGNOSIS — G4730 Sleep apnea, unspecified: Secondary | ICD-10-CM | POA: Diagnosis not present

## 2019-05-16 DIAGNOSIS — I700 Atherosclerosis of aorta: Secondary | ICD-10-CM | POA: Diagnosis not present

## 2019-05-16 DIAGNOSIS — Z0000 Encounter for general adult medical examination without abnormal findings: Secondary | ICD-10-CM | POA: Diagnosis not present

## 2019-05-16 DIAGNOSIS — E7800 Pure hypercholesterolemia, unspecified: Secondary | ICD-10-CM | POA: Diagnosis not present

## 2019-06-05 DIAGNOSIS — Z23 Encounter for immunization: Secondary | ICD-10-CM | POA: Diagnosis not present

## 2019-06-05 DIAGNOSIS — E7800 Pure hypercholesterolemia, unspecified: Secondary | ICD-10-CM | POA: Diagnosis not present

## 2019-06-05 DIAGNOSIS — Z1211 Encounter for screening for malignant neoplasm of colon: Secondary | ICD-10-CM | POA: Diagnosis not present

## 2019-06-05 DIAGNOSIS — Z125 Encounter for screening for malignant neoplasm of prostate: Secondary | ICD-10-CM | POA: Diagnosis not present

## 2019-06-05 DIAGNOSIS — E1169 Type 2 diabetes mellitus with other specified complication: Secondary | ICD-10-CM | POA: Diagnosis not present

## 2019-07-11 DIAGNOSIS — G4733 Obstructive sleep apnea (adult) (pediatric): Secondary | ICD-10-CM | POA: Diagnosis not present

## 2019-07-11 DIAGNOSIS — Z9989 Dependence on other enabling machines and devices: Secondary | ICD-10-CM | POA: Diagnosis not present

## 2019-07-11 DIAGNOSIS — F5104 Psychophysiologic insomnia: Secondary | ICD-10-CM | POA: Diagnosis not present

## 2019-07-14 DIAGNOSIS — K921 Melena: Secondary | ICD-10-CM | POA: Diagnosis not present

## 2019-07-22 DIAGNOSIS — Z1159 Encounter for screening for other viral diseases: Secondary | ICD-10-CM | POA: Diagnosis not present

## 2019-07-25 DIAGNOSIS — U071 COVID-19: Secondary | ICD-10-CM | POA: Diagnosis not present

## 2019-07-25 DIAGNOSIS — R195 Other fecal abnormalities: Secondary | ICD-10-CM | POA: Diagnosis not present

## 2019-07-30 DIAGNOSIS — G4733 Obstructive sleep apnea (adult) (pediatric): Secondary | ICD-10-CM | POA: Diagnosis not present

## 2019-10-14 DIAGNOSIS — G4733 Obstructive sleep apnea (adult) (pediatric): Secondary | ICD-10-CM | POA: Diagnosis not present

## 2020-02-04 DIAGNOSIS — Z1159 Encounter for screening for other viral diseases: Secondary | ICD-10-CM | POA: Diagnosis not present

## 2020-02-09 DIAGNOSIS — D120 Benign neoplasm of cecum: Secondary | ICD-10-CM | POA: Diagnosis not present

## 2020-02-09 DIAGNOSIS — K635 Polyp of colon: Secondary | ICD-10-CM | POA: Diagnosis not present

## 2020-02-09 DIAGNOSIS — R195 Other fecal abnormalities: Secondary | ICD-10-CM | POA: Diagnosis not present

## 2020-02-11 DIAGNOSIS — K635 Polyp of colon: Secondary | ICD-10-CM | POA: Diagnosis not present

## 2020-02-11 DIAGNOSIS — D120 Benign neoplasm of cecum: Secondary | ICD-10-CM | POA: Diagnosis not present

## 2020-03-15 DIAGNOSIS — H16223 Keratoconjunctivitis sicca, not specified as Sjogren's, bilateral: Secondary | ICD-10-CM | POA: Diagnosis not present

## 2020-03-15 DIAGNOSIS — Z0100 Encounter for examination of eyes and vision without abnormal findings: Secondary | ICD-10-CM | POA: Diagnosis not present

## 2020-03-15 DIAGNOSIS — H52203 Unspecified astigmatism, bilateral: Secondary | ICD-10-CM | POA: Diagnosis not present

## 2020-03-15 DIAGNOSIS — H02834 Dermatochalasis of left upper eyelid: Secondary | ICD-10-CM | POA: Diagnosis not present

## 2020-03-15 DIAGNOSIS — H524 Presbyopia: Secondary | ICD-10-CM | POA: Diagnosis not present

## 2020-03-15 DIAGNOSIS — H5203 Hypermetropia, bilateral: Secondary | ICD-10-CM | POA: Diagnosis not present

## 2020-03-15 DIAGNOSIS — H2513 Age-related nuclear cataract, bilateral: Secondary | ICD-10-CM | POA: Diagnosis not present

## 2020-03-15 DIAGNOSIS — H02831 Dermatochalasis of right upper eyelid: Secondary | ICD-10-CM | POA: Diagnosis not present

## 2020-03-15 DIAGNOSIS — H43393 Other vitreous opacities, bilateral: Secondary | ICD-10-CM | POA: Diagnosis not present

## 2020-03-17 DIAGNOSIS — G4733 Obstructive sleep apnea (adult) (pediatric): Secondary | ICD-10-CM | POA: Diagnosis not present

## 2020-04-28 DIAGNOSIS — M25562 Pain in left knee: Secondary | ICD-10-CM | POA: Diagnosis not present

## 2020-04-28 DIAGNOSIS — M545 Low back pain: Secondary | ICD-10-CM | POA: Diagnosis not present

## 2020-04-28 DIAGNOSIS — G8929 Other chronic pain: Secondary | ICD-10-CM | POA: Diagnosis not present

## 2020-04-28 DIAGNOSIS — M1712 Unilateral primary osteoarthritis, left knee: Secondary | ICD-10-CM | POA: Diagnosis not present

## 2020-05-12 DIAGNOSIS — M1712 Unilateral primary osteoarthritis, left knee: Secondary | ICD-10-CM | POA: Diagnosis not present

## 2020-06-02 DIAGNOSIS — M1712 Unilateral primary osteoarthritis, left knee: Secondary | ICD-10-CM | POA: Diagnosis not present

## 2020-07-09 DIAGNOSIS — G4733 Obstructive sleep apnea (adult) (pediatric): Secondary | ICD-10-CM | POA: Diagnosis not present

## 2020-08-09 DIAGNOSIS — R6884 Jaw pain: Secondary | ICD-10-CM | POA: Diagnosis not present

## 2020-08-09 DIAGNOSIS — K0501 Acute gingivitis, non-plaque induced: Secondary | ICD-10-CM | POA: Diagnosis not present

## 2020-09-24 DIAGNOSIS — R040 Epistaxis: Secondary | ICD-10-CM | POA: Diagnosis not present

## 2020-10-14 DIAGNOSIS — G4733 Obstructive sleep apnea (adult) (pediatric): Secondary | ICD-10-CM | POA: Diagnosis not present

## 2020-10-27 DIAGNOSIS — R040 Epistaxis: Secondary | ICD-10-CM | POA: Diagnosis not present

## 2020-11-02 DIAGNOSIS — M47812 Spondylosis without myelopathy or radiculopathy, cervical region: Secondary | ICD-10-CM | POA: Diagnosis not present

## 2020-11-02 DIAGNOSIS — Z8619 Personal history of other infectious and parasitic diseases: Secondary | ICD-10-CM | POA: Diagnosis not present

## 2020-11-02 DIAGNOSIS — G4730 Sleep apnea, unspecified: Secondary | ICD-10-CM | POA: Diagnosis not present

## 2020-11-02 DIAGNOSIS — E1169 Type 2 diabetes mellitus with other specified complication: Secondary | ICD-10-CM | POA: Diagnosis not present

## 2020-11-02 DIAGNOSIS — M79604 Pain in right leg: Secondary | ICD-10-CM | POA: Diagnosis not present

## 2020-11-02 DIAGNOSIS — K649 Unspecified hemorrhoids: Secondary | ICD-10-CM | POA: Diagnosis not present

## 2020-11-02 DIAGNOSIS — I700 Atherosclerosis of aorta: Secondary | ICD-10-CM | POA: Diagnosis not present

## 2020-11-02 DIAGNOSIS — K5900 Constipation, unspecified: Secondary | ICD-10-CM | POA: Diagnosis not present

## 2020-11-02 DIAGNOSIS — E7800 Pure hypercholesterolemia, unspecified: Secondary | ICD-10-CM | POA: Diagnosis not present

## 2020-12-17 DIAGNOSIS — Z9989 Dependence on other enabling machines and devices: Secondary | ICD-10-CM | POA: Diagnosis not present

## 2020-12-17 DIAGNOSIS — G4733 Obstructive sleep apnea (adult) (pediatric): Secondary | ICD-10-CM | POA: Diagnosis not present

## 2021-01-14 DIAGNOSIS — R040 Epistaxis: Secondary | ICD-10-CM | POA: Diagnosis not present

## 2021-01-14 DIAGNOSIS — G4733 Obstructive sleep apnea (adult) (pediatric): Secondary | ICD-10-CM | POA: Diagnosis not present

## 2021-01-14 DIAGNOSIS — Z8679 Personal history of other diseases of the circulatory system: Secondary | ICD-10-CM | POA: Diagnosis not present

## 2021-01-14 DIAGNOSIS — Z9989 Dependence on other enabling machines and devices: Secondary | ICD-10-CM | POA: Diagnosis not present

## 2021-01-20 DIAGNOSIS — G4733 Obstructive sleep apnea (adult) (pediatric): Secondary | ICD-10-CM | POA: Diagnosis not present

## 2021-02-16 DIAGNOSIS — G4733 Obstructive sleep apnea (adult) (pediatric): Secondary | ICD-10-CM | POA: Diagnosis not present

## 2021-03-03 DIAGNOSIS — S62626A Displaced fracture of medial phalanx of right little finger, initial encounter for closed fracture: Secondary | ICD-10-CM | POA: Diagnosis not present

## 2021-03-23 DIAGNOSIS — G4733 Obstructive sleep apnea (adult) (pediatric): Secondary | ICD-10-CM | POA: Diagnosis not present

## 2021-03-28 DIAGNOSIS — H5203 Hypermetropia, bilateral: Secondary | ICD-10-CM | POA: Diagnosis not present

## 2021-03-28 DIAGNOSIS — H02831 Dermatochalasis of right upper eyelid: Secondary | ICD-10-CM | POA: Diagnosis not present

## 2021-03-28 DIAGNOSIS — H16223 Keratoconjunctivitis sicca, not specified as Sjogren's, bilateral: Secondary | ICD-10-CM | POA: Diagnosis not present

## 2021-03-28 DIAGNOSIS — H02834 Dermatochalasis of left upper eyelid: Secondary | ICD-10-CM | POA: Diagnosis not present

## 2021-03-28 DIAGNOSIS — H524 Presbyopia: Secondary | ICD-10-CM | POA: Diagnosis not present

## 2021-03-28 DIAGNOSIS — H43393 Other vitreous opacities, bilateral: Secondary | ICD-10-CM | POA: Diagnosis not present

## 2021-03-28 DIAGNOSIS — H25013 Cortical age-related cataract, bilateral: Secondary | ICD-10-CM | POA: Diagnosis not present

## 2021-03-28 DIAGNOSIS — H52203 Unspecified astigmatism, bilateral: Secondary | ICD-10-CM | POA: Diagnosis not present

## 2021-03-28 DIAGNOSIS — H2513 Age-related nuclear cataract, bilateral: Secondary | ICD-10-CM | POA: Diagnosis not present

## 2021-04-11 DIAGNOSIS — G4733 Obstructive sleep apnea (adult) (pediatric): Secondary | ICD-10-CM | POA: Diagnosis not present

## 2021-04-22 DIAGNOSIS — G4733 Obstructive sleep apnea (adult) (pediatric): Secondary | ICD-10-CM | POA: Diagnosis not present

## 2021-05-02 DIAGNOSIS — L219 Seborrheic dermatitis, unspecified: Secondary | ICD-10-CM | POA: Diagnosis not present

## 2021-05-02 DIAGNOSIS — L989 Disorder of the skin and subcutaneous tissue, unspecified: Secondary | ICD-10-CM | POA: Diagnosis not present

## 2021-05-02 DIAGNOSIS — E7800 Pure hypercholesterolemia, unspecified: Secondary | ICD-10-CM | POA: Diagnosis not present

## 2021-05-02 DIAGNOSIS — G4730 Sleep apnea, unspecified: Secondary | ICD-10-CM | POA: Diagnosis not present

## 2021-05-02 DIAGNOSIS — L918 Other hypertrophic disorders of the skin: Secondary | ICD-10-CM | POA: Diagnosis not present

## 2021-05-02 DIAGNOSIS — E1169 Type 2 diabetes mellitus with other specified complication: Secondary | ICD-10-CM | POA: Diagnosis not present

## 2021-05-02 DIAGNOSIS — I700 Atherosclerosis of aorta: Secondary | ICD-10-CM | POA: Diagnosis not present

## 2021-05-02 DIAGNOSIS — M47812 Spondylosis without myelopathy or radiculopathy, cervical region: Secondary | ICD-10-CM | POA: Diagnosis not present

## 2021-07-21 DIAGNOSIS — G4733 Obstructive sleep apnea (adult) (pediatric): Secondary | ICD-10-CM | POA: Diagnosis not present

## 2021-10-13 DIAGNOSIS — J019 Acute sinusitis, unspecified: Secondary | ICD-10-CM | POA: Diagnosis not present

## 2022-01-16 DIAGNOSIS — N481 Balanitis: Secondary | ICD-10-CM | POA: Diagnosis not present

## 2022-01-16 DIAGNOSIS — L718 Other rosacea: Secondary | ICD-10-CM | POA: Diagnosis not present

## 2022-01-16 DIAGNOSIS — L918 Other hypertrophic disorders of the skin: Secondary | ICD-10-CM | POA: Diagnosis not present

## 2022-01-16 DIAGNOSIS — C4441 Basal cell carcinoma of skin of scalp and neck: Secondary | ICD-10-CM | POA: Diagnosis not present

## 2022-01-16 DIAGNOSIS — C44319 Basal cell carcinoma of skin of other parts of face: Secondary | ICD-10-CM | POA: Diagnosis not present

## 2022-01-16 DIAGNOSIS — C44519 Basal cell carcinoma of skin of other part of trunk: Secondary | ICD-10-CM | POA: Diagnosis not present

## 2022-01-16 DIAGNOSIS — D485 Neoplasm of uncertain behavior of skin: Secondary | ICD-10-CM | POA: Diagnosis not present

## 2022-04-10 DIAGNOSIS — H43393 Other vitreous opacities, bilateral: Secondary | ICD-10-CM | POA: Diagnosis not present

## 2022-04-10 DIAGNOSIS — H524 Presbyopia: Secondary | ICD-10-CM | POA: Diagnosis not present

## 2022-04-10 DIAGNOSIS — H16223 Keratoconjunctivitis sicca, not specified as Sjogren's, bilateral: Secondary | ICD-10-CM | POA: Diagnosis not present

## 2022-04-10 DIAGNOSIS — H2513 Age-related nuclear cataract, bilateral: Secondary | ICD-10-CM | POA: Diagnosis not present

## 2022-04-10 DIAGNOSIS — H02831 Dermatochalasis of right upper eyelid: Secondary | ICD-10-CM | POA: Diagnosis not present

## 2022-04-10 DIAGNOSIS — H02834 Dermatochalasis of left upper eyelid: Secondary | ICD-10-CM | POA: Diagnosis not present

## 2022-04-10 DIAGNOSIS — H5203 Hypermetropia, bilateral: Secondary | ICD-10-CM | POA: Diagnosis not present

## 2022-04-10 DIAGNOSIS — H25013 Cortical age-related cataract, bilateral: Secondary | ICD-10-CM | POA: Diagnosis not present

## 2022-04-10 DIAGNOSIS — H52203 Unspecified astigmatism, bilateral: Secondary | ICD-10-CM | POA: Diagnosis not present

## 2022-05-10 DIAGNOSIS — G4700 Insomnia, unspecified: Secondary | ICD-10-CM | POA: Diagnosis not present

## 2022-05-10 DIAGNOSIS — Z79899 Other long term (current) drug therapy: Secondary | ICD-10-CM | POA: Diagnosis not present

## 2022-05-10 DIAGNOSIS — G4733 Obstructive sleep apnea (adult) (pediatric): Secondary | ICD-10-CM | POA: Diagnosis not present

## 2022-05-10 DIAGNOSIS — Z9989 Dependence on other enabling machines and devices: Secondary | ICD-10-CM | POA: Diagnosis not present

## 2022-05-24 DIAGNOSIS — D485 Neoplasm of uncertain behavior of skin: Secondary | ICD-10-CM | POA: Diagnosis not present

## 2022-05-24 DIAGNOSIS — L918 Other hypertrophic disorders of the skin: Secondary | ICD-10-CM | POA: Diagnosis not present

## 2022-05-24 DIAGNOSIS — Z85828 Personal history of other malignant neoplasm of skin: Secondary | ICD-10-CM | POA: Diagnosis not present

## 2022-05-24 DIAGNOSIS — L821 Other seborrheic keratosis: Secondary | ICD-10-CM | POA: Diagnosis not present

## 2022-05-24 DIAGNOSIS — C44519 Basal cell carcinoma of skin of other part of trunk: Secondary | ICD-10-CM | POA: Diagnosis not present

## 2022-05-24 DIAGNOSIS — B078 Other viral warts: Secondary | ICD-10-CM | POA: Diagnosis not present

## 2022-09-28 DIAGNOSIS — E1169 Type 2 diabetes mellitus with other specified complication: Secondary | ICD-10-CM | POA: Diagnosis not present

## 2022-09-28 DIAGNOSIS — M47812 Spondylosis without myelopathy or radiculopathy, cervical region: Secondary | ICD-10-CM | POA: Diagnosis not present

## 2022-09-28 DIAGNOSIS — E7800 Pure hypercholesterolemia, unspecified: Secondary | ICD-10-CM | POA: Diagnosis not present

## 2022-09-28 DIAGNOSIS — G4730 Sleep apnea, unspecified: Secondary | ICD-10-CM | POA: Diagnosis not present

## 2022-09-28 DIAGNOSIS — I700 Atherosclerosis of aorta: Secondary | ICD-10-CM | POA: Diagnosis not present

## 2023-02-19 ENCOUNTER — Other Ambulatory Visit: Payer: Self-pay | Admitting: Family Medicine

## 2023-02-19 DIAGNOSIS — Z0000 Encounter for general adult medical examination without abnormal findings: Secondary | ICD-10-CM | POA: Diagnosis not present

## 2023-02-19 DIAGNOSIS — N5089 Other specified disorders of the male genital organs: Secondary | ICD-10-CM

## 2023-02-26 ENCOUNTER — Ambulatory Visit
Admission: RE | Admit: 2023-02-26 | Discharge: 2023-02-26 | Disposition: A | Discharge status: Home / Self Care | Payer: Medicare HMO | Source: Ambulatory Visit | Attending: Family Medicine | Admitting: Family Medicine

## 2023-02-26 DIAGNOSIS — N5089 Other specified disorders of the male genital organs: Secondary | ICD-10-CM

## 2023-02-26 DIAGNOSIS — N433 Hydrocele, unspecified: Secondary | ICD-10-CM | POA: Diagnosis not present

## 2023-03-06 ENCOUNTER — Ambulatory Visit: Payer: Medicare HMO | Admitting: Urology

## 2023-03-06 ENCOUNTER — Encounter: Payer: Self-pay | Admitting: Urology

## 2023-03-06 VITALS — BP 130/74 | HR 90 | Ht 79.0 in | Wt 238.0 lb

## 2023-03-06 DIAGNOSIS — N433 Hydrocele, unspecified: Secondary | ICD-10-CM

## 2023-03-06 NOTE — Progress Notes (Signed)
Assessment: 1. Hydrocele in adult; left     Plan: I personally reviewed the patient's chart including provider notes, and imaging results. I reviewed the scrotal ultrasound from 02/27/2023 with results as noted below. Diagnosis and management of a hydrocele discussed with the patient and his wife in detail today.  Specifically, I discussed surgical management of a hydrocele with hydrocelectomy.  The procedure and potential risk discussed.  Advised him that treatment is typically recommended in situations where the hydrocele is causing discomfort with certain clothing and activity.  Since he is currently asymptomatic, I think it is reasonable to observe at this time.  He is in agreement with this plan. Follow-up as needed.  Chief Complaint:  Chief Complaint  Patient presents with   Hydrocele    History of Present Illness:  Gabriel Hayes is a 72 y.o. male who is seen in consultation from Tally Joe, MD for evaluation of left hydrocele.  He has a history of left scrotal swelling for a number of years with recent increase in size.  No pain associated with the swelling.  No history of scrotal trauma or infection. Scrotal ultrasound from 02/27/2023 showed a large left hydrocele with normal testes bilaterally.  No lower urinary tract symptoms other than nocturia x 3 which she associates with his CPAP use. IPSS = 6 today.   Past Medical History:  Past Medical History:  Diagnosis Date   Atrial fibrillation (HCC)    Sleep apnea     Past Surgical History:  Past Surgical History:  Procedure Laterality Date   pulmonary vein ablation      Allergies:  No Known Allergies  Family History:  History reviewed. No pertinent family history.  Social History:  Social History   Tobacco Use   Smoking status: Never   Smokeless tobacco: Never  Substance Use Topics   Alcohol use: Never    Review of symptoms:  Constitutional:  Negative for unexplained weight loss, night sweats, fever,  chills ENT:  Negative for nose bleeds, sinus pain, painful swallowing CV:  Negative for chest pain, shortness of breath, exercise intolerance, palpitations, loss of consciousness Resp:  Negative for cough, wheezing, shortness of breath GI:  Negative for nausea, vomiting, diarrhea, bloody stools GU:  Positives noted in HPI; otherwise negative for gross hematuria, dysuria, urinary incontinence Neuro:  Negative for seizures, poor balance, limb weakness, slurred speech Psych:  Negative for lack of energy, depression, anxiety Endocrine:  Negative for polydipsia, polyuria, symptoms of hypoglycemia (dizziness, hunger, sweating) Hematologic:  Negative for anemia, purpura, petechia, prolonged or excessive bleeding, use of anticoagulants  Allergic:  Negative for difficulty breathing or choking as a result of exposure to anything; no shellfish allergy; no allergic response (rash/itch) to materials, foods  Physical exam: BP 130/74   Pulse 90   Ht 6\' 7"  (2.007 m)   Wt 238 lb (108 kg)   BMI 26.81 kg/m  GENERAL APPEARANCE:  Well appearing, well developed, well nourished, NAD HEENT: Atraumatic, Normocephalic, oropharynx clear. NECK: Supple without lymphadenopathy or thyromegaly. LUNGS: Clear to auscultation bilaterally. HEART: Regular Rate and Rhythm without murmurs, gallops, or rubs. ABDOMEN: Soft, non-tender, No Masses. EXTREMITIES: Moves all extremities well.  Without clubbing, cyanosis, or edema. NEUROLOGIC:  Alert and oriented x 3, normal gait, CN II-XII grossly intact.  MENTAL STATUS:  Appropriate. BACK:  Non-tender to palpation.  No CVAT SKIN:  Warm, dry and intact.   GU: Penis:  uncircumcised Meatus: Normal Scrotum: left scrotal enlargement consistent with hydrocele; no erythema or  edema; no hernia Testis: right normal; unable to palpate left due to hydrocele Epididymis: right normal; unable to palpate left   Results: None

## 2023-04-03 DIAGNOSIS — N433 Hydrocele, unspecified: Secondary | ICD-10-CM | POA: Diagnosis not present

## 2023-04-03 DIAGNOSIS — M47812 Spondylosis without myelopathy or radiculopathy, cervical region: Secondary | ICD-10-CM | POA: Diagnosis not present

## 2023-04-03 DIAGNOSIS — E1165 Type 2 diabetes mellitus with hyperglycemia: Secondary | ICD-10-CM | POA: Diagnosis not present

## 2023-04-03 DIAGNOSIS — E7800 Pure hypercholesterolemia, unspecified: Secondary | ICD-10-CM | POA: Diagnosis not present

## 2023-04-03 DIAGNOSIS — Z0000 Encounter for general adult medical examination without abnormal findings: Secondary | ICD-10-CM | POA: Diagnosis not present

## 2023-04-03 DIAGNOSIS — Z1331 Encounter for screening for depression: Secondary | ICD-10-CM | POA: Diagnosis not present

## 2023-04-03 DIAGNOSIS — I700 Atherosclerosis of aorta: Secondary | ICD-10-CM | POA: Diagnosis not present

## 2023-04-03 DIAGNOSIS — G4730 Sleep apnea, unspecified: Secondary | ICD-10-CM | POA: Diagnosis not present

## 2023-04-03 DIAGNOSIS — Z23 Encounter for immunization: Secondary | ICD-10-CM | POA: Diagnosis not present

## 2023-05-11 DIAGNOSIS — H25013 Cortical age-related cataract, bilateral: Secondary | ICD-10-CM | POA: Diagnosis not present

## 2023-05-11 DIAGNOSIS — H2513 Age-related nuclear cataract, bilateral: Secondary | ICD-10-CM | POA: Diagnosis not present

## 2023-05-11 DIAGNOSIS — H43393 Other vitreous opacities, bilateral: Secondary | ICD-10-CM | POA: Diagnosis not present

## 2023-05-11 DIAGNOSIS — H02831 Dermatochalasis of right upper eyelid: Secondary | ICD-10-CM | POA: Diagnosis not present

## 2023-05-11 DIAGNOSIS — H02834 Dermatochalasis of left upper eyelid: Secondary | ICD-10-CM | POA: Diagnosis not present

## 2023-05-11 DIAGNOSIS — H5203 Hypermetropia, bilateral: Secondary | ICD-10-CM | POA: Diagnosis not present

## 2023-05-11 DIAGNOSIS — H04123 Dry eye syndrome of bilateral lacrimal glands: Secondary | ICD-10-CM | POA: Diagnosis not present

## 2023-05-11 DIAGNOSIS — H52203 Unspecified astigmatism, bilateral: Secondary | ICD-10-CM | POA: Diagnosis not present

## 2023-05-11 DIAGNOSIS — H35372 Puckering of macula, left eye: Secondary | ICD-10-CM | POA: Diagnosis not present

## 2023-05-30 DIAGNOSIS — D485 Neoplasm of uncertain behavior of skin: Secondary | ICD-10-CM | POA: Diagnosis not present

## 2023-05-30 DIAGNOSIS — L905 Scar conditions and fibrosis of skin: Secondary | ICD-10-CM | POA: Diagnosis not present

## 2023-05-30 DIAGNOSIS — Z85828 Personal history of other malignant neoplasm of skin: Secondary | ICD-10-CM | POA: Diagnosis not present

## 2023-05-30 DIAGNOSIS — L821 Other seborrheic keratosis: Secondary | ICD-10-CM | POA: Diagnosis not present

## 2023-05-30 DIAGNOSIS — L918 Other hypertrophic disorders of the skin: Secondary | ICD-10-CM | POA: Diagnosis not present

## 2023-05-30 DIAGNOSIS — L718 Other rosacea: Secondary | ICD-10-CM | POA: Diagnosis not present

## 2023-05-30 DIAGNOSIS — L570 Actinic keratosis: Secondary | ICD-10-CM | POA: Diagnosis not present

## 2023-05-30 DIAGNOSIS — L080 Pyoderma: Secondary | ICD-10-CM | POA: Diagnosis not present

## 2023-05-30 DIAGNOSIS — D1801 Hemangioma of skin and subcutaneous tissue: Secondary | ICD-10-CM | POA: Diagnosis not present

## 2023-08-14 DIAGNOSIS — Z9989 Dependence on other enabling machines and devices: Secondary | ICD-10-CM | POA: Diagnosis not present

## 2023-08-14 DIAGNOSIS — G4733 Obstructive sleep apnea (adult) (pediatric): Secondary | ICD-10-CM | POA: Diagnosis not present

## 2023-10-04 DIAGNOSIS — N433 Hydrocele, unspecified: Secondary | ICD-10-CM | POA: Diagnosis not present

## 2023-10-04 DIAGNOSIS — E7800 Pure hypercholesterolemia, unspecified: Secondary | ICD-10-CM | POA: Diagnosis not present

## 2023-10-04 DIAGNOSIS — E1169 Type 2 diabetes mellitus with other specified complication: Secondary | ICD-10-CM | POA: Diagnosis not present

## 2023-10-04 DIAGNOSIS — M47812 Spondylosis without myelopathy or radiculopathy, cervical region: Secondary | ICD-10-CM | POA: Diagnosis not present

## 2023-10-04 DIAGNOSIS — G4730 Sleep apnea, unspecified: Secondary | ICD-10-CM | POA: Diagnosis not present

## 2023-10-08 ENCOUNTER — Encounter: Payer: Self-pay | Admitting: Urology

## 2023-10-08 ENCOUNTER — Ambulatory Visit (INDEPENDENT_AMBULATORY_CARE_PROVIDER_SITE_OTHER): Payer: Medicare HMO | Admitting: Urology

## 2023-10-08 VITALS — BP 98/60 | HR 93 | Ht 79.0 in | Wt 241.0 lb

## 2023-10-08 DIAGNOSIS — N433 Hydrocele, unspecified: Secondary | ICD-10-CM

## 2023-10-08 NOTE — Progress Notes (Signed)
   Assessment: 1. Hydrocele in adult; left     Plan: I again discussed the diagnosis and management of a hydrocele with the patient and his wife today.  Specifically, I discussed surgical management of a hydrocele with hydrocelectomy.  The procedure and potential risk discussed.  Potential risk of recurrent hydrocele around 6% discussed. I again advised him that treatment is typically recommended in situations where the hydrocele is causing discomfort with certain clothing and activity.   He would like to continue to monitor as he remains asymptomatic and he is reassured that the increase in size of the hydrocele is not concerning. Follow-up as needed.  Chief Complaint:  Chief Complaint  Patient presents with   Hydrocele    History of Present Illness:  Gabriel Hayes is a 73 y.o. male who is seen for further evaluation of left hydrocele.  He has a history of left scrotal swelling for a number of years with recent increase in size.  No pain associated with the swelling.  No history of scrotal trauma or infection. Scrotal ultrasound from 02/27/2023 showed a large left hydrocele with normal testes bilaterally. He was initially evaluated for the left hydrocele in June 2024.  At that time, he elected to monitor the hydrocele as he was asymptomatic. He has noted some increase in size of the hydrocele since June 2024. He had no pain associated with the hydrocele.  He was recently seen by his PCP who recommended repeat evaluation of the hydrocele due to the increase in size. No new urinary symptoms.  Portions of the above documentation were copied from a prior visit for review purposes only.   Past Medical History:  Past Medical History:  Diagnosis Date   Atrial fibrillation (HCC)    Sleep apnea     Past Surgical History:  Past Surgical History:  Procedure Laterality Date   pulmonary vein ablation      Allergies:  No Known Allergies  Family History:  No family history on  file.  Social History:  Social History   Tobacco Use   Smoking status: Never   Smokeless tobacco: Never  Substance Use Topics   Alcohol use: Never    ROS: Constitutional:  Negative for fever, chills, weight loss CV: Negative for chest pain, previous MI, hypertension Respiratory:  Negative for shortness of breath, wheezing, sleep apnea, frequent cough GI:  Negative for nausea, vomiting, bloody stool, GERD  Physical exam: BP 98/60   Pulse 93   Ht 6\' 7"  (2.007 m)   Wt 241 lb (109.3 kg)   BMI 27.15 kg/m  GENERAL APPEARANCE:  Well appearing, well developed, well nourished, NAD HEENT:  Atraumatic, normocephalic, oropharynx clear NECK:  Supple without lymphadenopathy or thyromegaly ABDOMEN:  Soft, non-tender, no masses EXTREMITIES:  Moves all extremities well, without clubbing, cyanosis, or edema NEUROLOGIC:  Alert and oriented x 3, normal gait, CN II-XII grossly intact MENTAL STATUS:  appropriate BACK:  Non-tender to palpation, No CVAT SKIN:  Warm, dry, and intact GU: Scrotum: Left scrotal enlargement without erythema consistent with hydrocele Testis: Right palpably normal; unable to palpate left due to hydrocele   Results: None

## 2023-10-23 DIAGNOSIS — H43811 Vitreous degeneration, right eye: Secondary | ICD-10-CM | POA: Diagnosis not present

## 2023-10-23 DIAGNOSIS — H35372 Puckering of macula, left eye: Secondary | ICD-10-CM | POA: Diagnosis not present

## 2023-10-23 DIAGNOSIS — H43391 Other vitreous opacities, right eye: Secondary | ICD-10-CM | POA: Diagnosis not present

## 2023-10-25 DIAGNOSIS — R040 Epistaxis: Secondary | ICD-10-CM | POA: Diagnosis not present

## 2023-10-31 DIAGNOSIS — R040 Epistaxis: Secondary | ICD-10-CM | POA: Diagnosis not present

## 2023-11-25 ENCOUNTER — Encounter: Payer: Self-pay | Admitting: Urology

## 2023-11-27 ENCOUNTER — Telehealth: Payer: Self-pay | Admitting: Urology

## 2023-11-27 NOTE — Telephone Encounter (Signed)
 LVM for pt to call back to sch with dr Adella Nissen:  "Please schedule appt for mid May 2025 for preop for hydrocele."

## 2024-01-10 DIAGNOSIS — H43391 Other vitreous opacities, right eye: Secondary | ICD-10-CM | POA: Diagnosis not present

## 2024-01-10 DIAGNOSIS — H43811 Vitreous degeneration, right eye: Secondary | ICD-10-CM | POA: Diagnosis not present

## 2024-01-30 ENCOUNTER — Encounter: Payer: Self-pay | Admitting: Urology

## 2024-01-30 ENCOUNTER — Ambulatory Visit (INDEPENDENT_AMBULATORY_CARE_PROVIDER_SITE_OTHER): Admitting: Urology

## 2024-01-30 VITALS — BP 113/70 | HR 80 | Ht 79.0 in | Wt 242.0 lb

## 2024-01-30 DIAGNOSIS — N433 Hydrocele, unspecified: Secondary | ICD-10-CM | POA: Diagnosis not present

## 2024-01-30 NOTE — H&P (View-Only) (Signed)
 Assessment: 1. Hydrocele in adult; left     Plan: I again discussed the diagnosis and management of a hydrocele with the patient and his wife today.  Specifically, I discussed surgical management of a hydrocele with hydrocelectomy.  The procedure and potential risk discussed.  Potential risk of infection, bleeding, injury to scrotal structures, recurrent hydrocele (around 6%), and anesthetic complications discussed. He would like to proceed with a left hydrocelectomy.  Procedure: The patient will be scheduled for left hydrocelectomy at [facility].  Surgical request is placed with the surgery schedulers and will be scheduled at the patient's/family request. Informed consent is given as documented below. Anesthesia: General  The patient does not have sleep apnea, history of MRSA, history of VRE, history of cardiac device requiring special anesthetic needs. Patient is stable and considered clear for surgical management in an outpatient ambulatory surgery setting as well as inpatient hospital setting.  Consent for Operation or Procedure: Provider Certification I hereby certify that the nature, purpose, benefits, usual and most frequent risks of, and alternatives to, the operation or procedure have been explained to the patient (or person authorized to sign for the patient) either by me as responsible physician or by the provider who is to perform the operation or procedure. Time spent such that the patient/family has had an opportunity to ask questions, and that those questions have been answered. The patient or the patient's representative has been advised that selected tasks may be performed by assistants to the primary health care provider(s). I believe that the patient (or person authorized to sign for the patient) understands what has been explained, and has consented to the operation or procedure. No guarantees were implied or made.   Chief Complaint:  Chief Complaint  Patient presents  with   Hydrocele    History of Present Illness:  Gabriel Hayes is a 73 y.o. male who is seen for further evaluation of left hydrocele.  He has a history of left scrotal swelling for a number of years with recent increase in size.  No pain associated with the swelling.  No history of scrotal trauma or infection. Scrotal ultrasound from 02/27/2023 showed a large left hydrocele with normal testes bilaterally. He was initially evaluated for the left hydrocele in June 2024.  At that time, he elected to monitor the hydrocele as he was asymptomatic. He has noted some increase in size of the hydrocele since June 2024. He had no pain associated with the hydrocele.  He was recently seen by his PCP who recommended repeat evaluation of the hydrocele due to the increase in size. No new urinary symptoms.  He returns today for further discussion of surgical management for the left hydrocele.  He reports a possible slight increase in size of the hydrocele.  He is more bothered due to the size of the hydrocele.  He would like to proceed with surgical management.  Portions of the above documentation were copied from a prior visit for review purposes only.   Past Medical History:  Past Medical History:  Diagnosis Date   Atrial fibrillation (HCC)    Sleep apnea     Past Surgical History:  Past Surgical History:  Procedure Laterality Date   pulmonary vein ablation      Allergies:  No Known Allergies  Family History:  No family history on file.  Social History:  Social History   Tobacco Use   Smoking status: Never   Smokeless tobacco: Never  Substance Use Topics  Alcohol use: Never    ROS: Constitutional:  Negative for fever, chills, weight loss CV: Negative for chest pain, previous MI, hypertension Respiratory:  Negative for shortness of breath, wheezing, sleep apnea, frequent cough GI:  Negative for nausea, vomiting, bloody stool, GERD   Physical exam: BP 113/70   Pulse 80   Ht  6\' 7"  (2.007 m)   Wt 242 lb (109.8 kg)   BMI 27.26 kg/m  GENERAL APPEARANCE:  Well appearing, well developed, well nourished, NAD HEENT:  Atraumatic, normocephalic, oropharynx clear NECK:  Supple without lymphadenopathy or thyromegaly ABDOMEN:  Soft, non-tender, no masses EXTREMITIES:  Moves all extremities well, without clubbing, cyanosis, or edema NEUROLOGIC:  Alert and oriented x 3, normal gait, CN II-XII grossly intact MENTAL STATUS:  appropriate BACK:  Non-tender to palpation, No CVAT SKIN:  Warm, dry, and intact GU: Scrotum: Left scrotal enlargement without erythema consistent with hydrocele Testis: Right palpably normal; unable to palpate left due to hydrocele   Results: None

## 2024-01-30 NOTE — Progress Notes (Signed)
 Assessment: 1. Hydrocele in adult; left     Plan: I again discussed the diagnosis and management of a hydrocele with the patient and his wife today.  Specifically, I discussed surgical management of a hydrocele with hydrocelectomy.  The procedure and potential risk discussed.  Potential risk of infection, bleeding, injury to scrotal structures, recurrent hydrocele (around 6%), and anesthetic complications discussed. He would like to proceed with a left hydrocelectomy.  Procedure: The patient will be scheduled for left hydrocelectomy at [facility].  Surgical request is placed with the surgery schedulers and will be scheduled at the patient's/family request. Informed consent is given as documented below. Anesthesia: General  The patient does not have sleep apnea, history of MRSA, history of VRE, history of cardiac device requiring special anesthetic needs. Patient is stable and considered clear for surgical management in an outpatient ambulatory surgery setting as well as inpatient hospital setting.  Consent for Operation or Procedure: Provider Certification I hereby certify that the nature, purpose, benefits, usual and most frequent risks of, and alternatives to, the operation or procedure have been explained to the patient (or person authorized to sign for the patient) either by me as responsible physician or by the provider who is to perform the operation or procedure. Time spent such that the patient/family has had an opportunity to ask questions, and that those questions have been answered. The patient or the patient's representative has been advised that selected tasks may be performed by assistants to the primary health care provider(s). I believe that the patient (or person authorized to sign for the patient) understands what has been explained, and has consented to the operation or procedure. No guarantees were implied or made.   Chief Complaint:  Chief Complaint  Patient presents  with   Hydrocele    History of Present Illness:  Gabriel Hayes is a 73 y.o. male who is seen for further evaluation of left hydrocele.  He has a history of left scrotal swelling for a number of years with recent increase in size.  No pain associated with the swelling.  No history of scrotal trauma or infection. Scrotal ultrasound from 02/27/2023 showed a large left hydrocele with normal testes bilaterally. He was initially evaluated for the left hydrocele in June 2024.  At that time, he elected to monitor the hydrocele as he was asymptomatic. He has noted some increase in size of the hydrocele since June 2024. He had no pain associated with the hydrocele.  He was recently seen by his PCP who recommended repeat evaluation of the hydrocele due to the increase in size. No new urinary symptoms.  He returns today for further discussion of surgical management for the left hydrocele.  He reports a possible slight increase in size of the hydrocele.  He is more bothered due to the size of the hydrocele.  He would like to proceed with surgical management.  Portions of the above documentation were copied from a prior visit for review purposes only.   Past Medical History:  Past Medical History:  Diagnosis Date   Atrial fibrillation (HCC)    Sleep apnea     Past Surgical History:  Past Surgical History:  Procedure Laterality Date   pulmonary vein ablation      Allergies:  No Known Allergies  Family History:  No family history on file.  Social History:  Social History   Tobacco Use   Smoking status: Never   Smokeless tobacco: Never  Substance Use Topics  Alcohol use: Never    ROS: Constitutional:  Negative for fever, chills, weight loss CV: Negative for chest pain, previous MI, hypertension Respiratory:  Negative for shortness of breath, wheezing, sleep apnea, frequent cough GI:  Negative for nausea, vomiting, bloody stool, GERD   Physical exam: BP 113/70   Pulse 80   Ht  6\' 7"  (2.007 m)   Wt 242 lb (109.8 kg)   BMI 27.26 kg/m  GENERAL APPEARANCE:  Well appearing, well developed, well nourished, NAD HEENT:  Atraumatic, normocephalic, oropharynx clear NECK:  Supple without lymphadenopathy or thyromegaly ABDOMEN:  Soft, non-tender, no masses EXTREMITIES:  Moves all extremities well, without clubbing, cyanosis, or edema NEUROLOGIC:  Alert and oriented x 3, normal gait, CN II-XII grossly intact MENTAL STATUS:  appropriate BACK:  Non-tender to palpation, No CVAT SKIN:  Warm, dry, and intact GU: Scrotum: Left scrotal enlargement without erythema consistent with hydrocele Testis: Right palpably normal; unable to palpate left due to hydrocele   Results: None

## 2024-02-01 ENCOUNTER — Telehealth: Payer: Self-pay

## 2024-02-01 NOTE — Telephone Encounter (Signed)
 Spoke with pt in reference to surgery on 6/3. Made aware of f/u appt and drain removal on 6/5 at 8:30a. Pt voiced understanding.

## 2024-02-05 ENCOUNTER — Ambulatory Visit: Payer: Self-pay | Admitting: Urology

## 2024-02-05 DIAGNOSIS — N433 Hydrocele, unspecified: Secondary | ICD-10-CM

## 2024-02-13 ENCOUNTER — Encounter (HOSPITAL_COMMUNITY): Payer: Self-pay

## 2024-02-13 ENCOUNTER — Encounter: Payer: Self-pay | Admitting: Urology

## 2024-02-13 ENCOUNTER — Encounter (HOSPITAL_COMMUNITY): Admission: RE | Admit: 2024-02-13 | Source: Ambulatory Visit

## 2024-02-14 ENCOUNTER — Encounter (HOSPITAL_COMMUNITY): Payer: Self-pay

## 2024-02-14 NOTE — Progress Notes (Addendum)
 Surgical Instructions   Your procedure is scheduled on Tuesday, June 3rd, 2025. Report to [facility] Main Entrance "A" at 1:00 P.M., then check in with the Admitting office. Any questions or running late day of surgery: call [phone]  Questions prior to your surgery date: call [phone], Monday-Friday, 8am-4pm. If you experience any cold or flu symptoms such as cough, fever, chills, shortness of breath, etc. between now and your scheduled surgery, please notify us  at the above number.     Remember:  Do not eat or drink after midnight the night before your surgery     Take these medicines the morning of surgery with A SIP OF WATER: Atorvastatin (Lipitor)   May take these medicines IF NEEDED:  None.     One week prior to surgery, STOP taking any Aspirin (unless otherwise instructed by your surgeon) Aleve, Naproxen, Ibuprofen, Motrin, Advil, Goody's, BC's, all herbal medications, fish oil, and non-prescription vitamins.     HOW TO MANAGE YOUR DIABETES BEFORE AND AFTER SURGERY  Why is it important to control my blood sugar before and after surgery? Improving blood sugar levels before and after surgery helps healing and can limit problems. A way of improving blood sugar control is eating a healthy diet by:  Eating less sugar and carbohydrates  Increasing activity/exercise  Talking with your doctor about reaching your blood sugar goals High blood sugars (greater than 180 mg/dL) can raise your risk of infections and slow your recovery, so you will need to focus on controlling your diabetes during the weeks before surgery. Make sure that the doctor who takes care of your diabetes knows about your planned surgery including the date and location.  How do I manage my blood sugar before surgery? Check your blood sugar at least 4 times a day, starting 2 days before surgery, to make sure that the level is not too high or low.  Check your blood sugar the morning of your surgery  when you wake up and every 2 hours until you get to the Short Stay unit.  If your blood sugar is less than 70 mg/dL, you will need to treat for low blood sugar: Do not take insulin. Treat a low blood sugar (less than 70 mg/dL) with  cup of clear juice (cranberry or apple), 4 glucose tablets, OR glucose gel. Recheck blood sugar in 15 minutes after treatment (to make sure it is greater than 70 mg/dL). If your blood sugar is not greater than 70 mg/dL on recheck, call [phone] for further instructions. Report your blood sugar to the short stay nurse when you get to Short Stay.  If you are admitted to the hospital after surgery: Your blood sugar will be checked by the staff and you will probably be given insulin after surgery (instead of oral diabetes medicines) to make sure you have good blood sugar levels. The goal for blood sugar control after surgery is 80-180 mg/dL.                      Do NOT Smoke (Tobacco/Vaping) for 24 hours prior to your procedure.  If you use a CPAP at night, you may bring your mask/headgear for your overnight stay.   You will be asked to remove any contacts, glasses, piercing's, hearing aid's, dentures/partials prior to surgery. Please bring cases for these items if needed.    Patients discharged the day of surgery will not be allowed to drive home, and someone needs to stay with them for  24 hours.  SURGICAL WAITING ROOM VISITATION Patients may have no more than 2 support people in the waiting area - these visitors may rotate.   Pre-op nurse will coordinate an appropriate time for 1 ADULT support person, who may not rotate, to accompany patient in pre-op.  Children under the age of 17 must have an adult with them who is not the patient and must remain in the main waiting area with an adult.  If the patient needs to stay at the hospital during part of their recovery, the visitor guidelines for inpatient rooms apply.  Please refer to the [facility] website for  the visitor guidelines for any additional information.   If you received a COVID test during your pre-op visit  it is requested that you wear a mask when out in public, stay away from anyone that may not be feeling well and notify your surgeon if you develop symptoms. If you have been in contact with anyone that has tested positive in the last 10 days please notify you surgeon.      Pre-operative CHG Bathing Instructions   You can play a key role in reducing the risk of infection after surgery. Your skin needs to be as free of germs as possible. You can reduce the number of germs on your skin by washing with CHG (chlorhexidine gluconate) soap before surgery. CHG is an antiseptic soap that kills germs and continues to kill germs even after washing.   DO NOT use if you have an allergy to chlorhexidine/CHG or antibacterial soaps. If your skin becomes reddened or irritated, stop using the CHG and notify one of our RNs at [phone].              TAKE A SHOWER THE NIGHT BEFORE SURGERY AND THE DAY OF SURGERY    Please keep in mind the following:  DO NOT shave, including legs and underarms, 48 hours prior to surgery.   You may shave your face before/day of surgery.  Place clean sheets on your bed the night before surgery Use a clean washcloth (not used since being washed) for each shower. DO NOT sleep with pet's night before surgery.  CHG Shower Instructions:  Wash your face and private area with normal soap. If you choose to wash your hair, wash first with your normal shampoo.  After you use shampoo/soap, rinse your hair and body thoroughly to remove shampoo/soap residue.  Turn the water OFF and apply half the bottle of CHG soap to a CLEAN washcloth.  Apply CHG soap ONLY FROM YOUR NECK DOWN TO YOUR TOES (washing for 3-5 minutes)  DO NOT use CHG soap on face, private areas, open wounds, or sores.  Pay special attention to the area where your surgery is being performed.  If you are having  back surgery, having someone wash your back for you may be helpful. Wait 2 minutes after CHG soap is applied, then you may rinse off the CHG soap.  Pat dry with a clean towel  Put on clean pajamas    Additional instructions for the day of surgery: DO NOT APPLY any lotions, deodorants, cologne, or perfumes.   Do not wear jewelry or makeup Do not wear nail polish, gel polish, artificial nails, or any other type of covering on natural nails (fingers and toes) Do not bring valuables to the hospital. [facility] is not responsible for valuables/personal belongings. Put on clean/comfortable clothes.  Please brush your teeth.  Ask your nurse before applying any  prescription medications to the skin.

## 2024-02-15 ENCOUNTER — Encounter (HOSPITAL_COMMUNITY): Payer: Self-pay

## 2024-02-15 ENCOUNTER — Encounter (HOSPITAL_COMMUNITY)
Admission: RE | Admit: 2024-02-15 | Discharge: 2024-02-15 | Disposition: A | Discharge status: Home / Self Care | Source: Ambulatory Visit | Attending: Urology | Admitting: Urology

## 2024-02-15 ENCOUNTER — Other Ambulatory Visit: Payer: Self-pay

## 2024-02-15 VITALS — BP 120/68 | HR 88 | Temp 98.1°F | Resp 17 | Ht 79.0 in | Wt 231.0 lb

## 2024-02-15 DIAGNOSIS — N433 Hydrocele, unspecified: Secondary | ICD-10-CM | POA: Insufficient documentation

## 2024-02-15 DIAGNOSIS — Z01812 Encounter for preprocedural laboratory examination: Secondary | ICD-10-CM | POA: Diagnosis present

## 2024-02-15 DIAGNOSIS — Z01818 Encounter for other preprocedural examination: Secondary | ICD-10-CM | POA: Insufficient documentation

## 2024-02-15 DIAGNOSIS — Z79899 Other long term (current) drug therapy: Secondary | ICD-10-CM | POA: Insufficient documentation

## 2024-02-15 DIAGNOSIS — Z01810 Encounter for preprocedural cardiovascular examination: Secondary | ICD-10-CM | POA: Diagnosis present

## 2024-02-15 HISTORY — DX: Type 2 diabetes mellitus without complications: E11.9

## 2024-02-15 LAB — BASIC METABOLIC PANEL WITH GFR
Anion gap: 9 (ref 5–15)
BUN: 17 mg/dL (ref 8–23)
CO2: 23 mmol/L (ref 22–32)
Calcium: 8.8 mg/dL — ABNORMAL LOW (ref 8.9–10.3)
Chloride: 104 mmol/L (ref 98–111)
Creatinine, Ser: 0.87 mg/dL (ref 0.61–1.24)
GFR, Estimated: >60 mL/min (ref >60)
Glucose, Bld: 129 mg/dL — ABNORMAL HIGH (ref 70–99)
Potassium: 4.2 mmol/L (ref 3.5–5.1)
Sodium: 136 mmol/L (ref 135–145)

## 2024-02-15 LAB — CBC
HCT: 38.2 % — ABNORMAL LOW (ref 39.0–52.0)
Hemoglobin: 13.4 g/dL (ref 13.0–17.0)
MCH: 33.8 pg (ref 26.0–34.0)
MCHC: 35.1 g/dL (ref 30.0–36.0)
MCV: 96.5 fL (ref 80.0–100.0)
Platelets: 194 10*3/uL (ref 150–400)
RBC: 3.96 MIL/uL — ABNORMAL LOW (ref 4.22–5.81)
RDW: 12.2 % (ref 11.5–15.5)
WBC: 5.6 10*3/uL (ref 4.0–10.5)
nRBC: 0 % (ref 0.0–0.2)

## 2024-02-15 LAB — HEMOGLOBIN A1C
Hgb A1c MFr Bld: 6.1 % — ABNORMAL HIGH (ref 4.8–5.6)
Mean Plasma Glucose: 128.37 mg/dL

## 2024-02-15 NOTE — Progress Notes (Signed)
 PCP - Drema Genta Cardiologist -Richardean Chancellor Barrett Cheek,MD- LOV 04/29/2018;pt to return " if symptoms worsen or do not improve." Per PCP Dr. Will Hare  note from 10/04/23, pt has not had any episodes of atrial fibrillation since 2014.  PPM/ICD - denies Device Orders -  Rep Notified -   Chest x-ray - na EKG - 02/15/24 Stress Test - denies ECHO - denies Cardiac Cath - denies  Sleep Study - +OSA CPAP - yes  Fasting Blood Sugar - pt denies he has diabetes and does not check his blood sugar. He says he does not a have a meter.  Checks Blood Sugar _____ times a day  Last dose of GLP1 agonist-  na GLP1 instructions:   Blood Thinner Instructions:na Aspirin Instructions:na  ERAS Protcol -no PRE-SURGERY Ensure or G2-   COVID TEST- na   Anesthesia review: no  Patient denies shortness of breath, fever, cough and chest pain at PAT appointment   All instructions explained to the patient, with a verbal understanding of the material. Patient agrees to go over the instructions while at home for a better understanding. The opportunity to ask questions was provided.

## 2024-02-18 NOTE — Anesthesia Preprocedure Evaluation (Addendum)
 Anesthesia Evaluation  Patient identified by MRN, date of birth, ID band Patient awake    Reviewed: Allergy & Precautions, NPO status , Patient's Chart, lab work & pertinent test results  Airway Mallampati: II  TM Distance: >3 FB Neck ROM: Full    Dental  (+) Teeth Intact, Dental Advisory Given   Pulmonary sleep apnea and Continuous Positive Airway Pressure Ventilation    Pulmonary exam normal breath sounds clear to auscultation       Cardiovascular Normal cardiovascular exam+ dysrhythmias (s/p ablation 2012) Atrial Fibrillation (-) pacemaker Rhythm:Regular Rate:Normal     Neuro/Psych negative neurological ROS  negative psych ROS   GI/Hepatic negative GI ROS, Neg liver ROS,,,  Endo/Other  diabetes, Well Controlled  A1c 6.1  Renal/GU negative Renal ROSLab Results      Component                Value               Date                         K                        4.2                 02/15/2024                CO2                      23                  02/15/2024                BUN                      17                  02/15/2024                CREATININE               0.87                02/15/2024                GFRNONAA                 >60                 02/15/2024                CALCIUM                  8.8 (L)             02/15/2024                GLUCOSE                  129 (H)             02/15/2024             negative genitourinary   Musculoskeletal negative musculoskeletal ROS (+)    Abdominal   Peds  (+) ADHD Hematology negative hematology ROS (+) Lab Results      Component  Value               Date                      WBC                      5.6                 02/15/2024                HGB                      13.4                02/15/2024                HCT                      38.2 (L)            02/15/2024                MCV                      96.5                02/15/2024                 PLT                      194                 02/15/2024              Anesthesia Other Findings   Reproductive/Obstetrics negative OB ROS                             Anesthesia Physical Anesthesia Plan  ASA: 2  Anesthesia Plan: General   Post-op Pain Management: Tylenol PO (pre-op)*   Induction: Intravenous  PONV Risk Score and Plan: 4 or greater and Treatment may vary due to age or medical condition, Ondansetron and Dexamethasone   Airway Management Planned: LMA  Additional Equipment: None  Intra-op Plan:   Post-operative Plan: Extubation in OR  Informed Consent: I have reviewed the patients History and Physical, chart, labs and discussed the procedure including the risks, benefits and alternatives for the proposed anesthesia with the patient or authorized representative who has indicated his/her understanding and acceptance.     Dental advisory given  Plan Discussed with: CRNA  Anesthesia Plan Comments:        Anesthesia Quick Evaluation

## 2024-02-19 ENCOUNTER — Other Ambulatory Visit: Payer: Self-pay

## 2024-02-19 ENCOUNTER — Ambulatory Visit (HOSPITAL_COMMUNITY): Admitting: Anesthesiology

## 2024-02-19 ENCOUNTER — Encounter (HOSPITAL_COMMUNITY): Payer: Self-pay | Admitting: Urology

## 2024-02-19 ENCOUNTER — Ambulatory Visit (HOSPITAL_COMMUNITY)
Admission: RE | Admit: 2024-02-19 | Discharge: 2024-02-19 | Disposition: A | Discharge status: Home / Self Care | Attending: Urology | Admitting: Urology

## 2024-02-19 ENCOUNTER — Encounter (HOSPITAL_COMMUNITY)
Admission: RE | Disposition: A | Discharge status: Home / Self Care | Payer: Self-pay | Source: Home / Self Care | Attending: Urology

## 2024-02-19 DIAGNOSIS — I4891 Unspecified atrial fibrillation: Secondary | ICD-10-CM

## 2024-02-19 DIAGNOSIS — E119 Type 2 diabetes mellitus without complications: Secondary | ICD-10-CM | POA: Diagnosis not present

## 2024-02-19 DIAGNOSIS — N433 Hydrocele, unspecified: Secondary | ICD-10-CM | POA: Insufficient documentation

## 2024-02-19 DIAGNOSIS — G4733 Obstructive sleep apnea (adult) (pediatric): Secondary | ICD-10-CM | POA: Diagnosis not present

## 2024-02-19 DIAGNOSIS — G4730 Sleep apnea, unspecified: Secondary | ICD-10-CM | POA: Insufficient documentation

## 2024-02-19 HISTORY — PX: HYDROCELE EXCISION: SHX482

## 2024-02-19 LAB — GLUCOSE, CAPILLARY
Glucose-Capillary: 122 mg/dL — ABNORMAL HIGH (ref 70–99)
Glucose-Capillary: 110 mg/dL — ABNORMAL HIGH (ref 70–99)

## 2024-02-19 SURGERY — HYDROCELECTOMY
Anesthesia: General | Site: Scrotum | Laterality: Left

## 2024-02-19 MED ORDER — LACTATED RINGERS IV SOLN
INTRAVENOUS | Status: DC
Start: 2024-02-19 — End: 2024-02-20

## 2024-02-19 MED ORDER — HYDROCODONE-ACETAMINOPHEN 5-325 MG PO TABS: 1.0000 | ORAL_TABLET | Freq: Four times a day (QID) | ORAL | 0 refills | Status: DC | PRN

## 2024-02-19 MED ORDER — BUPIVACAINE HCL (PF) 0.25 % IJ SOLN
INTRAMUSCULAR | Status: DC | PRN
Start: 2024-02-19 — End: 2024-02-19
  Administered 2024-02-19: 10 mL

## 2024-02-19 MED ORDER — ORAL CARE MOUTH RINSE: 15.0000 mL | Freq: Once | OROMUCOSAL | Status: AC

## 2024-02-19 MED ORDER — DEXAMETHASONE SODIUM PHOSPHATE 10 MG/ML IJ SOLN
INTRAMUSCULAR | Status: DC | PRN
  Administered 2024-02-19: 5 mg via INTRAVENOUS

## 2024-02-19 MED ORDER — CEPHALEXIN 500 MG PO CAPS: 500.0000 mg | ORAL_CAPSULE | Freq: Three times a day (TID) | ORAL | 0 refills | Status: AC

## 2024-02-19 MED ORDER — ONDANSETRON HCL 4 MG/2ML IJ SOLN: 4.0000 mg | Freq: Once | INTRAMUSCULAR | Status: DC | PRN

## 2024-02-19 MED ORDER — BUPIVACAINE HCL (PF) 0.25 % IJ SOLN
INTRAMUSCULAR | Status: AC
  Filled 2024-02-19: qty 30

## 2024-02-19 MED ORDER — FENTANYL CITRATE (PF) 100 MCG/2ML IJ SOLN
INTRAMUSCULAR | Status: AC
  Filled 2024-02-19: qty 2

## 2024-02-19 MED ORDER — PROPOFOL 10 MG/ML IV BOLUS
INTRAVENOUS | Status: DC | PRN
  Administered 2024-02-19: 180 mg via INTRAVENOUS

## 2024-02-19 MED ORDER — ONDANSETRON HCL 4 MG/2ML IJ SOLN
INTRAMUSCULAR | Status: DC | PRN
  Administered 2024-02-19: 4 mg via INTRAVENOUS

## 2024-02-19 MED ORDER — ACETAMINOPHEN 10 MG/ML IV SOLN: 1000.0000 mg | Freq: Once | INTRAVENOUS | Status: DC | PRN

## 2024-02-19 MED ORDER — CHLORHEXIDINE GLUCONATE 0.12 % MT SOLN
15.0000 mL | Freq: Once | OROMUCOSAL | Status: AC
  Administered 2024-02-19: 15 mL via OROMUCOSAL
  Filled 2024-02-19: qty 15

## 2024-02-19 MED ORDER — FENTANYL CITRATE (PF) 100 MCG/2ML IJ SOLN
25.0000 ug | INTRAMUSCULAR | Status: DC | PRN
  Administered 2024-02-19: 50 ug via INTRAVENOUS

## 2024-02-19 MED ORDER — CEFAZOLIN SODIUM-DEXTROSE 2-4 GM/100ML-% IV SOLN
2.0000 g | INTRAVENOUS | Status: AC
  Administered 2024-02-19: 2 g via INTRAVENOUS
  Filled 2024-02-19: qty 100

## 2024-02-19 MED ORDER — LIDOCAINE 2% (20 MG/ML) 5 ML SYRINGE
INTRAMUSCULAR | Status: DC | PRN
  Administered 2024-02-19: 100 mg via INTRAVENOUS

## 2024-02-19 MED ORDER — PROPOFOL 10 MG/ML IV BOLUS
INTRAVENOUS | Status: AC
  Filled 2024-02-19: qty 20

## 2024-02-19 MED ORDER — INSULIN ASPART 100 UNIT/ML IJ SOLN: 0.0000 [IU] | INTRAMUSCULAR | Status: DC | PRN

## 2024-02-19 MED ORDER — ACETAMINOPHEN 500 MG PO TABS
ORAL_TABLET | ORAL | Status: DC
Start: 2024-02-19 — End: 2024-02-20
  Filled 2024-02-19: qty 2

## 2024-02-19 MED ORDER — ACETAMINOPHEN 500 MG PO TABS
1000.0000 mg | ORAL_TABLET | Freq: Once | ORAL | Status: AC
  Administered 2024-02-19: 1000 mg via ORAL

## 2024-02-19 MED ORDER — FENTANYL CITRATE (PF) 250 MCG/5ML IJ SOLN
INTRAMUSCULAR | Status: DC | PRN
  Administered 2024-02-19: 50 ug via INTRAVENOUS

## 2024-02-19 MED ORDER — 0.9 % SODIUM CHLORIDE (POUR BTL) OPTIME
TOPICAL | Status: DC | PRN
  Administered 2024-02-19: 1000 mL

## 2024-02-19 MED ORDER — PHENYLEPHRINE HCL (PRESSORS) 10 MG/ML IV SOLN
INTRAVENOUS | Status: DC | PRN
  Administered 2024-02-19: 80 ug via INTRAVENOUS

## 2024-02-19 SURGICAL SUPPLY — 30 items
BLADE SURG 15 STRL LF DISP TIS (BLADE) ×1 IMPLANT
BNDG GAUZE DERMACEA FLUFF 4 (GAUZE/BANDAGES/DRESSINGS) IMPLANT
COVER LIGHT HANDLE STERIS (MISCELLANEOUS) ×2 IMPLANT
DECANTER SPIKE VIAL GLASS SM (MISCELLANEOUS) ×1 IMPLANT
DERMABOND ADVANCED .7 DNX12 (GAUZE/BANDAGES/DRESSINGS) ×1 IMPLANT
DRAIN PENROSE 12X.25 LTX STRL (MISCELLANEOUS) IMPLANT
DRAIN PENROSE 1X12 (DRAIN) ×1 IMPLANT
ELECTRODE REM PT RTRN 9FT ADLT (ELECTROSURGICAL) ×1 IMPLANT
GAUZE 4X4 16PLY ~~LOC~~+RFID DBL (SPONGE) ×1 IMPLANT
GAUZE SPONGE 4X4 12PLY STRL (GAUZE/BANDAGES/DRESSINGS) ×2 IMPLANT
GLOVE BIO SURGEON STRL SZ8 (GLOVE) ×1 IMPLANT
GLOVE BIOGEL PI IND STRL 7.0 (GLOVE) ×2 IMPLANT
GLOVE BIOGEL PI IND STRL 8 (GLOVE) ×1 IMPLANT
GOWN STRL REUS W/TWL LRG LVL3 (GOWN DISPOSABLE) ×1 IMPLANT
GOWN STRL REUS W/TWL XL LVL3 (GOWN DISPOSABLE) ×1 IMPLANT
KIT TURNOVER KIT A (KITS) ×1 IMPLANT
MANIFOLD NEPTUNE II (INSTRUMENTS) ×1 IMPLANT
NDL HYPO 25X1 1.5 SAFETY (NEEDLE) ×1 IMPLANT
NEEDLE HYPO 25X1 1.5 SAFETY (NEEDLE) ×1 IMPLANT
NS IRRIG 1000ML POUR BTL (IV SOLUTION) ×1 IMPLANT
PACK MINOR (CUSTOM PROCEDURE TRAY) ×1 IMPLANT
PAD ARMBOARD POSITIONER FOAM (MISCELLANEOUS) ×1 IMPLANT
PENCIL BUTTON HOLSTER BLD 10FT (ELECTRODE) IMPLANT
POSITIONER HEAD 8X9X4 ADT (SOFTGOODS) ×1 IMPLANT
SET BASIN LINEN APH (SET/KITS/TRAYS/PACK) ×1 IMPLANT
SUPPORT SCROTAL LG STRP (MISCELLANEOUS) ×1 IMPLANT
SUT CHROMIC 3 0 SH 27 (SUTURE) ×1 IMPLANT
SUT MNCRL AB 4-0 PS2 18 (SUTURE) IMPLANT
SUT VIC AB 3-0 SH 27X BRD (SUTURE) ×1 IMPLANT
SYR CONTROL 10ML LL (SYRINGE) ×1 IMPLANT

## 2024-02-19 NOTE — Progress Notes (Signed)
 Fentanyl 50mcg wasted with Barbaraann Bookbinder RN to witness.

## 2024-02-19 NOTE — Anesthesia Postprocedure Evaluation (Signed)
 Anesthesia Post Note  Patient: Gabriel Hayes  Procedure(s) Performed: HYDROCELECTOMY (Left: Scrotum)     Patient location during evaluation: PACU Anesthesia Type: General Level of consciousness: awake and alert, patient cooperative and oriented Pain management: pain level controlled Vital Signs Assessment: post-procedure vital signs reviewed and stable Respiratory status: spontaneous breathing, nonlabored ventilation and respiratory function stable Cardiovascular status: blood pressure returned to baseline and stable Postop Assessment: no apparent nausea or vomiting Anesthetic complications: no   No notable events documented.  Last Vitals:  Vitals:   02/19/24 1800 02/19/24 1815  BP: 127/76 133/71  Pulse: 73 70  Resp: 14 15  Temp:    SpO2: 100% 99%    Last Pain:  Vitals:   02/19/24 1815  TempSrc:   PainSc: 3                  Viktoriya Glaspy,E. Boomer Winders

## 2024-02-19 NOTE — Interval H&P Note (Signed)
 History and Physical Interval Note:  02/19/2024 2:01 PM  Gabriel Hayes  has presented today for surgery, with the diagnosis of Hydrocele in adult.  The various methods of treatment have been discussed with the patient and family. After consideration of risks, benefits and other options for treatment, the patient has consented to  Procedure(s): HYDROCELECTOMY (Left) as a surgical intervention.  The patient's history has been reviewed, patient examined, no change in status, stable for surgery.  I have reviewed the patient's chart and labs.  Questions were answered to the patient's satisfaction.     [name]

## 2024-02-19 NOTE — Transfer of Care (Signed)
 Immediate Anesthesia Transfer of Care Note  Patient: Gabriel Hayes  Procedure(s) Performed: HYDROCELECTOMY (Left: Scrotum)  Patient Location: PACU  Anesthesia Type:General  Level of Consciousness: awake, alert , and oriented  Airway & Oxygen Therapy: Patient Spontanous Breathing and Patient connected to nasal cannula oxygen  Post-op Assessment: Report given to RN and Post -op Vital signs reviewed and stable  Post vital signs: Reviewed and stable  Last Vitals:  Vitals Value Taken Time  BP 139/82 02/19/24 1757  Temp    Pulse 73 02/19/24 1759  Resp 14 02/19/24 1759  SpO2 100 % 02/19/24 1759  Vitals shown include unfiled device data.  Last Pain:  Vitals:   02/19/24 1338  TempSrc:   PainSc: 0-No pain         Complications: No notable events documented.

## 2024-02-19 NOTE — Anesthesia Procedure Notes (Signed)
 Procedure Name: LMA Insertion Date/Time: 02/19/2024 4:43 PM  Performed by: Linard Reno, CRNAPre-anesthesia Checklist: Patient identified, Emergency Drugs available, Suction available and Patient being monitored Patient Re-evaluated:Patient Re-evaluated prior to induction Oxygen Delivery Method: Circle System Utilized Preoxygenation: Pre-oxygenation with 100% oxygen Induction Type: IV induction Ventilation: Mask ventilation without difficulty LMA: LMA inserted and LMA with gastric port inserted LMA Size: 5.0 Number of attempts: 1 Airway Equipment and Method: Bite block Placement Confirmation: positive ETCO2 Tube secured with: Tape Dental Injury: Teeth and Oropharynx as per pre-operative assessment

## 2024-02-19 NOTE — Op Note (Signed)
 OPERATIVE NOTE   Patient Name: Gabriel Hayes  MRN: [id number]   Date of Procedure: 02/19/24    Preoperative diagnosis:  Left hydrocele  Postoperative diagnosis:  Left hydrocele  Procedure:  Left hydrocelectomy  Attending: Mellie Sprinkle, MD  Anesthesia: General With local  Estimated blood loss: 5 mL  Fluids: Per anesthesia record  Drains: Penrose drain in left scrotum  Specimens: Left hydrocele sac  Antibiotics: Ancef 2 g IV  Findings: Left hydrocele; normal testes and cord  Indications:  73 year old male presents for surgical management of a left hydrocele.  He has a history of left scrotal swelling for a number of years and has had a gradual increase in size recently.  No pain associated with the swelling.  Scrotal ultrasound from 6/24 showed a large left hydrocele with normal testes bilaterally.  He initially elected to monitor the hydrocele but has now elected to proceed with surgical management.  He presents for a left hydrocelectomy. Potential risk of infection, bleeding, injury to scrotal structures, recurrent hydrocele (around 6%), and anesthetic complications discussed.  He understands and wishes to proceed as described.  Description of Procedure:  The patient was taken to the operating room suite and properly identified.  After successful induction of a general anesthetic, he was placed in the supine position.  All pressure points were appropriately padded.  The patient received IV Ancef preoperatively.  The patient's genital area was prepped and draped in sterile fashion.  A preoperative timeout was performed.  Examination demonstrated enlargement of the left scrotum consistent with the known left hydrocele.  A incision was made along the median raphae.  Dissection was carried down through the dartos.  Overlying layers of tissue were carefully dissected off of the hydrocele sac.  The hydrocele was delivered into the wound.  The hydrocele was opened with  return of approximately 300 mL of straw-colored fluid.  There was an area of the hydrocele sac that was adherent to the cord structures.  During careful dissection of this area off of the cord, the vas deferens was encountered.  I elected to divide the vas to allow eversion of the hydrocele sac behind the testicle and cord.  The vasa was ligated using 3-0 Vicryl suture.  Excellent hemostasis was noted.  No other cord structures were encountered.  The excess hydrocele sac was excised and sent to pathology.  Hemostasis of the edges of the hydrocele sac was obtained with the cautery.  The edges were then everted behind the testicle and cord and sutured using a running 3-0 Vicryl suture.  Care was taken to avoid any undue tension on the cord.  Hemostasis of the scrotum was obtained with the cautery.  1/4 inch Penrose drain was placed in the dependent portion of the left scrotum and secured with a 3-0 chromic suture.  The testicle and epididymis were normal in appearance at this portion of the procedure.  The testicle was placed back into the scrotum in its normal anatomical position.  The dartos fascia was closed with a running 3-0 Vicryl suture.  10 mL of 0.25% plain Marcaine was injected into the wound.  3-0 chromic suture was then used to close the skin.  Dermabond was applied.  Sterile fluffs and a scrotal support were placed at the end of the procedure.  The patient was then extubated and taken to the postanesthesia care unit in stable condition.  All counts were correct at the end of the procedure.  Complications: None  Condition: Stable, extubated,  transferred to PACU  Plan:  Discharge to home. Return to office in 2 days for drain removal.

## 2024-02-20 ENCOUNTER — Encounter (HOSPITAL_COMMUNITY): Payer: Self-pay | Admitting: Urology

## 2024-02-21 ENCOUNTER — Ambulatory Visit (INDEPENDENT_AMBULATORY_CARE_PROVIDER_SITE_OTHER): Admitting: Urology

## 2024-02-21 ENCOUNTER — Encounter: Payer: Self-pay | Admitting: Urology

## 2024-02-21 VITALS — BP 115/63 | HR 80 | Ht 79.0 in | Wt 231.0 lb

## 2024-02-21 DIAGNOSIS — N433 Hydrocele, unspecified: Secondary | ICD-10-CM

## 2024-02-21 DIAGNOSIS — Z09 Encounter for follow-up examination after completed treatment for conditions other than malignant neoplasm: Secondary | ICD-10-CM

## 2024-02-21 DIAGNOSIS — Z87438 Personal history of other diseases of male genital organs: Secondary | ICD-10-CM

## 2024-02-21 LAB — SURGICAL PATHOLOGY

## 2024-02-21 NOTE — Progress Notes (Signed)
   Assessment: 1. Hydrocele in adult     Plan: Penrose drain removed today. Continue postoperative restrictions and local care to scrotum. Return to office in 1 month.  Chief Complaint:  Chief Complaint  Patient presents with   Hydrocele    History of Present Illness:  Gabriel Hayes is a 73 y.o. male who is seen for further evaluation of left hydrocele.  He has a history of left scrotal swelling for a number of years with recent increase in size.  No pain associated with the swelling.  No history of scrotal trauma or infection. Scrotal ultrasound from 02/27/2023 showed a large left hydrocele with normal testes bilaterally. He was initially evaluated for the left hydrocele in June 2024.  At that time, he elected to monitor the hydrocele as he was asymptomatic. He has noted some increase in size of the hydrocele since June 2024. He had no pain associated with the hydrocele.  He was recently seen by his PCP who recommended repeat evaluation of the hydrocele due to the increase in size. No new urinary symptoms.  He underwent a left hydrocelectomy on 02/19/2024.  Pathology pending. He presents today for removal of a Penrose drain. He has been well following the procedure.  No pain.  He has had minimal drainage.  No fevers or chills.  Portions of the above documentation were copied from a prior visit for review purposes only.   Past Medical History:  Past Medical History:  Diagnosis Date   Atrial fibrillation (HCC)    Diabetes mellitus without complication (HCC)    Sleep apnea     Past Surgical History:  Past Surgical History:  Procedure Laterality Date   HYDROCELE EXCISION Left 02/19/2024   Procedure: HYDROCELECTOMY;  Surgeon: Mellie Sprinkle., MD;  Location: [facility] OR;  Service: Urology;  Laterality: Left;   pulmonary vein ablation      Allergies:  No Known Allergies  Family History:  No family history on file.  Social History:  Social History   Tobacco Use    Smoking status: Never   Smokeless tobacco: Never  Vaping Use   Vaping status: Never Used  Substance Use Topics   Alcohol use: Never   Drug use: Never    ROS: Constitutional:  Negative for fever, chills, weight loss CV: Negative for chest pain, previous MI, hypertension Respiratory:  Negative for shortness of breath, wheezing, sleep apnea, frequent cough GI:  Negative for nausea, vomiting, bloody stool, GERD   Physical exam: BP 115/63   Pulse 80   Ht 6\' 7"  (2.007 m)   Wt 231 lb (104.8 kg)   BMI 26.02 kg/m  GU: Left scrotum with minimal swelling; incision intact; Penrose drain removed without difficulty.  Results: None

## 2024-02-22 ENCOUNTER — Ambulatory Visit: Payer: Self-pay | Admitting: Urology

## 2024-03-17 DIAGNOSIS — E1165 Type 2 diabetes mellitus with hyperglycemia: Secondary | ICD-10-CM | POA: Diagnosis not present

## 2024-03-17 DIAGNOSIS — E1169 Type 2 diabetes mellitus with other specified complication: Secondary | ICD-10-CM | POA: Diagnosis not present

## 2024-03-17 DIAGNOSIS — M47812 Spondylosis without myelopathy or radiculopathy, cervical region: Secondary | ICD-10-CM | POA: Diagnosis not present

## 2024-03-17 DIAGNOSIS — E7800 Pure hypercholesterolemia, unspecified: Secondary | ICD-10-CM | POA: Diagnosis not present

## 2024-04-01 ENCOUNTER — Ambulatory Visit: Admitting: Urology

## 2024-04-01 ENCOUNTER — Encounter: Payer: Self-pay | Admitting: Urology

## 2024-04-01 VITALS — BP 118/72 | HR 78 | Ht 79.0 in | Wt 224.0 lb

## 2024-04-01 DIAGNOSIS — N433 Hydrocele, unspecified: Secondary | ICD-10-CM

## 2024-04-01 DIAGNOSIS — Z09 Encounter for follow-up examination after completed treatment for conditions other than malignant neoplasm: Secondary | ICD-10-CM

## 2024-04-01 DIAGNOSIS — Z87438 Personal history of other diseases of male genital organs: Secondary | ICD-10-CM

## 2024-04-01 NOTE — Progress Notes (Signed)
   Assessment: 1. Hydrocele in adult     Plan: Doing well following left hydrocelectomy. Return to office as needed.  Chief Complaint:  Chief Complaint  Patient presents with   Hydrocele    History of Present Illness:  Gabriel Hayes is a 73 y.o. male who is seen for further evaluation of left hydrocele.  He has a history of left scrotal swelling for a number of years with recent increase in size.  No pain associated with the swelling.  No history of scrotal trauma or infection. Scrotal ultrasound from 02/27/2023 showed a large left hydrocele with normal testes bilaterally. He was initially evaluated for the left hydrocele in June 2024.  At that time, he elected to monitor the hydrocele as he was asymptomatic. He has noted some increase in size of the hydrocele since June 2024. He had no pain associated with the hydrocele.  He was recently seen by his PCP who recommended repeat evaluation of the hydrocele due to the increase in size. No new urinary symptoms.  He underwent a left hydrocelectomy on 02/19/2024.  Pathology consistent with hydrocele. His drain was removed on 02/21/2024.  He returns today for follow-up.  He is doing well postoperatively.  He is not having any significant swelling or pain in the left scrotum.  The incision has healed well.  Portions of the above documentation were copied from a prior visit for review purposes only.   Past Medical History:  Past Medical History:  Diagnosis Date   Atrial fibrillation (HCC)    Diabetes mellitus without complication (HCC)    Sleep apnea     Past Surgical History:  Past Surgical History:  Procedure Laterality Date   HYDROCELE EXCISION Left 02/19/2024   Procedure: HYDROCELECTOMY;  Surgeon: Roseann Adine PARAS., MD;  Location: [facility] OR;  Service: Urology;  Laterality: Left;   pulmonary vein ablation      Allergies:  No Known Allergies  Family History:  No family history on file.  Social History:  Social History    Tobacco Use   Smoking status: Never   Smokeless tobacco: Never  Vaping Use   Vaping status: Never Used  Substance Use Topics   Alcohol use: Never   Drug use: Never    ROS: Constitutional:  Negative for fever, chills, weight loss CV: Negative for chest pain, previous MI, hypertension Respiratory:  Negative for shortness of breath, wheezing, sleep apnea, frequent cough GI:  Negative for nausea, vomiting, bloody stool, GERD   Physical exam: BP 118/72   Pulse 78   Ht 6' 7 (2.007 m)   Wt 224 lb (101.6 kg)   BMI 25.23 kg/m  GU: Scrotal incision well-healed; no recurrent hydrocele noted; minimal swelling of the left testicle without tenderness.  Results: None

## 2024-04-04 DIAGNOSIS — E7800 Pure hypercholesterolemia, unspecified: Secondary | ICD-10-CM | POA: Diagnosis not present

## 2024-04-04 DIAGNOSIS — Z0000 Encounter for general adult medical examination without abnormal findings: Secondary | ICD-10-CM | POA: Diagnosis not present

## 2024-04-04 DIAGNOSIS — Z9889 Other specified postprocedural states: Secondary | ICD-10-CM | POA: Diagnosis not present

## 2024-04-04 DIAGNOSIS — B351 Tinea unguium: Secondary | ICD-10-CM | POA: Diagnosis not present

## 2024-04-04 DIAGNOSIS — Z1331 Encounter for screening for depression: Secondary | ICD-10-CM | POA: Diagnosis not present

## 2024-04-04 DIAGNOSIS — H6121 Impacted cerumen, right ear: Secondary | ICD-10-CM | POA: Diagnosis not present

## 2024-04-04 DIAGNOSIS — G4730 Sleep apnea, unspecified: Secondary | ICD-10-CM | POA: Diagnosis not present

## 2024-04-04 DIAGNOSIS — M47812 Spondylosis without myelopathy or radiculopathy, cervical region: Secondary | ICD-10-CM | POA: Diagnosis not present

## 2024-04-04 DIAGNOSIS — E1165 Type 2 diabetes mellitus with hyperglycemia: Secondary | ICD-10-CM | POA: Diagnosis not present

## 2024-04-17 DIAGNOSIS — E1169 Type 2 diabetes mellitus with other specified complication: Secondary | ICD-10-CM | POA: Diagnosis not present

## 2024-04-17 DIAGNOSIS — E7800 Pure hypercholesterolemia, unspecified: Secondary | ICD-10-CM | POA: Diagnosis not present

## 2024-04-17 DIAGNOSIS — E1165 Type 2 diabetes mellitus with hyperglycemia: Secondary | ICD-10-CM | POA: Diagnosis not present

## 2024-04-17 DIAGNOSIS — M47812 Spondylosis without myelopathy or radiculopathy, cervical region: Secondary | ICD-10-CM | POA: Diagnosis not present

## 2024-05-18 DIAGNOSIS — E1165 Type 2 diabetes mellitus with hyperglycemia: Secondary | ICD-10-CM | POA: Diagnosis not present

## 2024-05-18 DIAGNOSIS — E1169 Type 2 diabetes mellitus with other specified complication: Secondary | ICD-10-CM | POA: Diagnosis not present

## 2024-05-18 DIAGNOSIS — M47812 Spondylosis without myelopathy or radiculopathy, cervical region: Secondary | ICD-10-CM | POA: Diagnosis not present

## 2024-05-18 DIAGNOSIS — E7800 Pure hypercholesterolemia, unspecified: Secondary | ICD-10-CM | POA: Diagnosis not present

## 2024-05-29 DIAGNOSIS — L570 Actinic keratosis: Secondary | ICD-10-CM | POA: Diagnosis not present

## 2024-05-29 DIAGNOSIS — L905 Scar conditions and fibrosis of skin: Secondary | ICD-10-CM | POA: Diagnosis not present

## 2024-05-29 DIAGNOSIS — L718 Other rosacea: Secondary | ICD-10-CM | POA: Diagnosis not present

## 2024-05-29 DIAGNOSIS — Z85828 Personal history of other malignant neoplasm of skin: Secondary | ICD-10-CM | POA: Diagnosis not present

## 2024-05-29 DIAGNOSIS — L821 Other seborrheic keratosis: Secondary | ICD-10-CM | POA: Diagnosis not present

## 2024-05-29 DIAGNOSIS — L918 Other hypertrophic disorders of the skin: Secondary | ICD-10-CM | POA: Diagnosis not present

## 2024-05-29 DIAGNOSIS — I788 Other diseases of capillaries: Secondary | ICD-10-CM | POA: Diagnosis not present

## 2024-06-17 DIAGNOSIS — E1165 Type 2 diabetes mellitus with hyperglycemia: Secondary | ICD-10-CM | POA: Diagnosis not present

## 2024-06-17 DIAGNOSIS — M47812 Spondylosis without myelopathy or radiculopathy, cervical region: Secondary | ICD-10-CM | POA: Diagnosis not present

## 2024-06-17 DIAGNOSIS — E1169 Type 2 diabetes mellitus with other specified complication: Secondary | ICD-10-CM | POA: Diagnosis not present

## 2024-06-17 DIAGNOSIS — E7800 Pure hypercholesterolemia, unspecified: Secondary | ICD-10-CM | POA: Diagnosis not present

## 2024-07-15 DIAGNOSIS — H04123 Dry eye syndrome of bilateral lacrimal glands: Secondary | ICD-10-CM | POA: Diagnosis not present

## 2024-07-15 DIAGNOSIS — H43811 Vitreous degeneration, right eye: Secondary | ICD-10-CM | POA: Diagnosis not present

## 2024-07-15 DIAGNOSIS — H25013 Cortical age-related cataract, bilateral: Secondary | ICD-10-CM | POA: Diagnosis not present

## 2024-07-15 DIAGNOSIS — H43393 Other vitreous opacities, bilateral: Secondary | ICD-10-CM | POA: Diagnosis not present

## 2024-07-15 DIAGNOSIS — H02831 Dermatochalasis of right upper eyelid: Secondary | ICD-10-CM | POA: Diagnosis not present

## 2024-07-15 DIAGNOSIS — H25042 Posterior subcapsular polar age-related cataract, left eye: Secondary | ICD-10-CM | POA: Diagnosis not present

## 2024-07-15 DIAGNOSIS — H35372 Puckering of macula, left eye: Secondary | ICD-10-CM | POA: Diagnosis not present

## 2024-07-15 DIAGNOSIS — H02834 Dermatochalasis of left upper eyelid: Secondary | ICD-10-CM | POA: Diagnosis not present

## 2024-07-15 DIAGNOSIS — Z0100 Encounter for examination of eyes and vision without abnormal findings: Secondary | ICD-10-CM | POA: Diagnosis not present

## 2024-07-15 DIAGNOSIS — H2513 Age-related nuclear cataract, bilateral: Secondary | ICD-10-CM | POA: Diagnosis not present

## 2024-07-18 DIAGNOSIS — M47812 Spondylosis without myelopathy or radiculopathy, cervical region: Secondary | ICD-10-CM | POA: Diagnosis not present

## 2024-07-18 DIAGNOSIS — E1165 Type 2 diabetes mellitus with hyperglycemia: Secondary | ICD-10-CM | POA: Diagnosis not present

## 2024-07-18 DIAGNOSIS — E7800 Pure hypercholesterolemia, unspecified: Secondary | ICD-10-CM | POA: Diagnosis not present

## 2024-07-18 DIAGNOSIS — E1169 Type 2 diabetes mellitus with other specified complication: Secondary | ICD-10-CM | POA: Diagnosis not present

## 2024-08-17 DIAGNOSIS — M47812 Spondylosis without myelopathy or radiculopathy, cervical region: Secondary | ICD-10-CM | POA: Diagnosis not present

## 2024-08-17 DIAGNOSIS — E1169 Type 2 diabetes mellitus with other specified complication: Secondary | ICD-10-CM | POA: Diagnosis not present

## 2024-08-17 DIAGNOSIS — E7800 Pure hypercholesterolemia, unspecified: Secondary | ICD-10-CM | POA: Diagnosis not present

## 2024-08-17 DIAGNOSIS — E1165 Type 2 diabetes mellitus with hyperglycemia: Secondary | ICD-10-CM | POA: Diagnosis not present
